# Patient Record
Sex: Female | Born: 1995 | Race: Black or African American | Hispanic: No | Marital: Single | Smoking: Current every day smoker
Health system: Southern US, Community
[De-identification: ages and names within clinical notes are randomized; demographics above are authoritative.]

## PROBLEM LIST (undated history)

## (undated) DIAGNOSIS — F329 Major depressive disorder, single episode, unspecified: Secondary | ICD-10-CM

## (undated) DIAGNOSIS — J353 Hypertrophy of tonsils with hypertrophy of adenoids: Secondary | ICD-10-CM

## (undated) DIAGNOSIS — F32A Depression, unspecified: Secondary | ICD-10-CM

## (undated) DIAGNOSIS — R0981 Nasal congestion: Secondary | ICD-10-CM

## (undated) HISTORY — PX: WISDOM TOOTH EXTRACTION: SHX21

## (undated) HISTORY — PX: TONSILLECTOMY: SUR1361

---
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
## 2015-01-23 ENCOUNTER — Encounter (HOSPITAL_COMMUNITY): Payer: Self-pay | Admitting: Emergency Medicine

## 2015-01-23 ENCOUNTER — Emergency Department (HOSPITAL_COMMUNITY)
Admission: EM | Admit: 2015-01-23 | Discharge: 2015-01-23 | Disposition: A | Discharge status: Home / Self Care | Payer: Medicaid Other | Attending: Emergency Medicine | Admitting: Emergency Medicine

## 2015-01-23 DIAGNOSIS — H5711 Ocular pain, right eye: Secondary | ICD-10-CM | POA: Diagnosis present

## 2015-01-23 DIAGNOSIS — Z8709 Personal history of other diseases of the respiratory system: Secondary | ICD-10-CM | POA: Insufficient documentation

## 2015-01-23 DIAGNOSIS — F329 Major depressive disorder, single episode, unspecified: Secondary | ICD-10-CM | POA: Insufficient documentation

## 2015-01-23 DIAGNOSIS — H18821 Corneal disorder due to contact lens, right eye: Secondary | ICD-10-CM | POA: Diagnosis not present

## 2015-01-23 DIAGNOSIS — Z79899 Other long term (current) drug therapy: Secondary | ICD-10-CM | POA: Diagnosis not present

## 2015-01-23 MED ORDER — HYDROCODONE-ACETAMINOPHEN 5-325 MG PO TABS: 1.0000 | ORAL_TABLET | ORAL | Status: DC | PRN

## 2015-01-23 MED ORDER — TETRACAINE HCL 0.5 % OP SOLN
OPHTHALMIC | Status: AC
Start: 2015-01-23 — End: 2015-01-23
  Administered 2015-01-23: 1 [drp] via OPHTHALMIC
  Filled 2015-01-23: qty 4

## 2015-01-23 MED ORDER — ERYTHROMYCIN 5 MG/GM OP OINT
TOPICAL_OINTMENT | Freq: Once | OPHTHALMIC | Status: AC
  Administered 2015-01-23: 1 via OPHTHALMIC
  Filled 2015-01-23: qty 3.5

## 2015-01-23 MED ORDER — TETRACAINE HCL 0.5 % OP SOLN
1.0000 [drp] | Freq: Once | OPHTHALMIC | Status: AC
  Administered 2015-01-23: 1 [drp] via OPHTHALMIC

## 2015-01-23 NOTE — Discharge Instructions (Signed)
Avoid rubbing your eye and do not wear your contacts for the next 7 days or until your symptoms are completely resolved. Use your glasses.  Apply erythromycin ointment to your eye every 4 hours for the next 2-4 days. You may stop once your pain is completely resolved.  Avoid bright lights (wear sunglasses).  Avoid rubbing your eye.  Get rechecked by your eye doctor in 3 days if not improved (or return here for any worsened symptoms).  You may take the hydrocodone prescribed for pain relief.  This will make you drowsy - do not drive within 4 hours of taking this medication.

## 2015-01-23 NOTE — ED Notes (Signed)
Patient c/o right eye pain and blurry vision in right eye that started yesterday after waking up. Per patient slept with contacts in the night before. Patient states she was unable to tolerate light in that eye yesterday and now having some orbital swelling.

## 2015-01-23 NOTE — ED Notes (Signed)
Pt states stinging to right eye and states eye was matted shut most of the day yesterday. Drainage is described both as clear and yellow in color. Pt states she fell to sleep with contacts in the night before last.

## 2015-01-23 NOTE — ED Provider Notes (Signed)
CSN: [id number]     Arrival date & time 01/23/15  1138 History  By signing my name below, I, Gwenyth Ober, attest that this documentation has been prepared under the direction and in the presence of Burgess Amor, PA-C.  Electronically Signed: Gwenyth Ober, ED Scribe. 01/23/2015. 12:25 PM.   Chief Complaint  Patient presents with  . Eye Pain   The history is provided by the patient. No language interpreter was used.    HPI Comments: Isabel Blackwell is a 19 y.o. female who presents to the Emergency Department complaining of constant, moderate right eye pain that started 2 days ago after she slept while wearing her contacts. She states watering of her right eye, a mild visual disturbance and some photophobia with no relief. Pt wears extended wear contacts and was in the middle of the wearing cycle. She has a history of similar symptoms in her right eye, but was not evaluated for pain. Her tetanus is UTD. Pt denies a gritty or foreign body sensation. She also denies left eye pain.  Ophthalmologist at [facility]  Past Medical History  Diagnosis Date  . Tonsillar and adenoid hypertrophy 11/2012    snores during sleep, mother denies apnea  . Nasal congestion 12/04/2012  . Depression    Past Surgical History  Procedure Laterality Date  . Tonsillectomy and adenoidectomy Bilateral 12/09/2012    Procedure: BILATERAL TONSILLECTOMY AND ADENOIDECTOMY;  Surgeon: Darletta Moll, MD;  Location: [facility];  Service: ENT;  Laterality: Bilateral;  . Wisdom tooth extraction    . Colonoscopy N/A 04/13/2014    BX NL-ILEUM, COLON, RECTUM  . Tonsillectomy     Family History  Problem Relation Age of Onset  . Hypertension Mother   . Heart disease Maternal Grandfather   . Colon cancer Neg Hx    Social History  Substance Use Topics  . Smoking status: Never Smoker   . Smokeless tobacco: Never Used  . Alcohol Use: No   OB History    Gravida Para Term Preterm AB TAB SAB Ectopic  Multiple Living       Review of Systems  Constitutional: Negative for fever.  HENT: Negative.  Negative for congestion and sore throat.   Eyes: Positive for photophobia, pain and visual disturbance.  Skin: Negative.  Negative for rash.  Neurological: Negative for dizziness, light-headedness and headaches.  All other systems reviewed and are negative.  Allergies  Nitrofurantoin monohyd macro  Home Medications   Prior to Admission medications   Medication Sig Start Date End Date Taking? Authorizing Provider  escitalopram (LEXAPRO) 10 MG tablet Take 1 tablet (10 mg total) by mouth daily. 12/06/14   Beau Fanny, FNP  etonogestrel (IMPLANON) 68 MG IMPL implant 1 each (68 mg total) by Subdermal route once. 12/06/14   Beau Fanny, FNP  HYDROcodone-acetaminophen (NORCO/VICODIN) 5-325 MG tablet Take 1 tablet by mouth every 4 (four) hours as needed. 01/23/15   Burgess Amor, PA-C  traZODone (DESYREL) 50 MG tablet Take 1 tablet (50 mg total) by mouth at bedtime as needed for sleep. 12/06/14   Everardo All Withrow, FNP   BP 125/84 mmHg  Pulse 82  Temp(Src) 98.1 F (36.7 C) (Oral)  Resp 16  Ht  (1.575 m)  Wt 43.863 kg  BMI 17.68 kg/m2  SpO2 100% Physical Exam  Constitutional: She appears well-developed and well-nourished. No distress.  HENT:  Head: Normocephalic and atraumatic.  Eyes: EOM are normal. Pupils are equal, round, and reactive to light. Right eye exhibits no chemosis. Right conjunctiva is injected.  Slit lamp exam:      The right eye shows corneal abrasion (at the 5 o'clock position of right eye at the periphery of cornea. small abrasion) and fluorescein uptake. The right eye shows no corneal ulcer and no foreign body.  Clear tearing from the right eye  Neck: Neck supple. No tracheal deviation present.  Cardiovascular: Normal rate.   Pulmonary/Chest: Effort normal. No respiratory distress.  Skin: Skin is warm and dry.  Psychiatric: She has a normal mood  and affect. Her behavior is normal.  Nursing note and vitals reviewed.   ED Course  Procedures  DIAGNOSTIC STUDIES: Oxygen Saturation is 100% on RA, normal by my interpretation.    COORDINATION OF CARE: 12:32 PM Discussed treatment plan with pt which includes erythromycin ointment and recommended follow-up with her ophthalmologist in 3 days with no improvement in symptoms. She agreed to plan.  Labs Review Labs Reviewed - No data to display  Imaging Review No results found.   EKG Interpretation None      MDM   Final diagnoses:  Corneal abrasion due to contact lens, right    Pt placed on erythromycin ophthalmic ointment (given).  Hydrocodone.  Advised to avoid contact lens use for one week or until sx completely resolved.  Advised f/u with ophthalmologist if sx are not improved over the next 2-3 days.  I personally performed the services described in this documentation, which was scribed in my presence. The recorded information has been reviewed and is accurate.   Burgess AmorJulie Bates [place], PA-C 01/25/15 2340  Blane OharaJoshua Zavitz, MD 01/28/15 [id number]

## 2015-04-08 ENCOUNTER — Emergency Department (HOSPITAL_COMMUNITY): Payer: Medicaid Other

## 2015-04-08 ENCOUNTER — Encounter (HOSPITAL_COMMUNITY): Payer: Self-pay | Admitting: Emergency Medicine

## 2015-04-08 ENCOUNTER — Emergency Department (HOSPITAL_COMMUNITY)
Admission: EM | Admit: 2015-04-08 | Discharge: 2015-04-08 | Disposition: A | Discharge status: Home / Self Care | Payer: Medicaid Other | Attending: Emergency Medicine | Admitting: Emergency Medicine

## 2015-04-08 DIAGNOSIS — Z8709 Personal history of other diseases of the respiratory system: Secondary | ICD-10-CM | POA: Diagnosis not present

## 2015-04-08 DIAGNOSIS — S29002A Unspecified injury of muscle and tendon of back wall of thorax, initial encounter: Secondary | ICD-10-CM | POA: Diagnosis not present

## 2015-04-08 DIAGNOSIS — Y92410 Unspecified street and highway as the place of occurrence of the external cause: Secondary | ICD-10-CM | POA: Diagnosis not present

## 2015-04-08 DIAGNOSIS — Y9389 Activity, other specified: Secondary | ICD-10-CM | POA: Diagnosis not present

## 2015-04-08 DIAGNOSIS — Z79899 Other long term (current) drug therapy: Secondary | ICD-10-CM | POA: Insufficient documentation

## 2015-04-08 DIAGNOSIS — R300 Dysuria: Secondary | ICD-10-CM | POA: Insufficient documentation

## 2015-04-08 DIAGNOSIS — F329 Major depressive disorder, single episode, unspecified: Secondary | ICD-10-CM | POA: Diagnosis not present

## 2015-04-08 DIAGNOSIS — Y998 Other external cause status: Secondary | ICD-10-CM | POA: Diagnosis not present

## 2015-04-08 DIAGNOSIS — Z3202 Encounter for pregnancy test, result negative: Secondary | ICD-10-CM | POA: Diagnosis not present

## 2015-04-08 DIAGNOSIS — F17210 Nicotine dependence, cigarettes, uncomplicated: Secondary | ICD-10-CM | POA: Diagnosis not present

## 2015-04-08 DIAGNOSIS — M546 Pain in thoracic spine: Secondary | ICD-10-CM

## 2015-04-08 LAB — URINALYSIS, ROUTINE W REFLEX MICROSCOPIC
Bilirubin Urine: NEGATIVE
Glucose, UA: NEGATIVE mg/dL
Ketones, ur: 15 mg/dL — AB
LEUKOCYTES UA: NEGATIVE
NITRITE: NEGATIVE
PH: 6.5 (ref 5.0–8.0)
SPECIFIC GRAVITY, URINE: 1.025 (ref 1.005–1.030)

## 2015-04-08 LAB — URINE MICROSCOPIC-ADD ON

## 2015-04-08 LAB — POC URINE PREG, ED: Preg Test, Ur: NEGATIVE

## 2015-04-08 MED ORDER — CEPHALEXIN 500 MG PO CAPS: 500.0000 mg | ORAL_CAPSULE | Freq: Four times a day (QID) | ORAL | Status: DC

## 2015-04-08 MED ORDER — CYCLOBENZAPRINE HCL 10 MG PO TABS: 10.0000 mg | ORAL_TABLET | Freq: Two times a day (BID) | ORAL | Status: DC | PRN
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
## 2015-04-08 NOTE — Discharge Instructions (Signed)
X-ray of back is normal. Minor evidence of a urinary tract infection. Prescription for muscle relaxer and antibiotic. Increase fluids. You will be sore for several days.

## 2015-04-11 LAB — URINE CULTURE

## 2015-04-12 ENCOUNTER — Telehealth (HOSPITAL_BASED_OUTPATIENT_CLINIC_OR_DEPARTMENT_OTHER): Payer: Self-pay | Admitting: Emergency Medicine

## 2015-04-12 NOTE — Telephone Encounter (Signed)
Post ED Visit - Positive Culture Follow-up  Culture report reviewed by antimicrobial stewardship pharmacist:   Enzo Bi, Pharm.D.  Celedonio Miyamoto, Pharm.D., BCPS  Garvin Fila, Pharm.D.  Georgina Pillion, Pharm.D., BCPS  [place], [address].D., BCPS, AAHIVP  Estella Husk, Pharm.D., BCPS, AAHIVP  Tennis Must, [address].D.  Sherle Poe, [address].D.  Positive urine culture lactobacillus Treated with cephalexin, organism sensitive to the same and no further patient follow-up is required at this time.  Berle Mull 04/12/2015, 9:01 AM

## 2015-05-18 ENCOUNTER — Encounter (HOSPITAL_COMMUNITY): Payer: Self-pay | Admitting: *Deleted

## 2015-05-18 ENCOUNTER — Emergency Department (HOSPITAL_COMMUNITY)
Admission: EM | Admit: 2015-05-18 | Discharge: 2015-05-18 | Disposition: A | Discharge status: Home / Self Care | Payer: Medicaid Other | Attending: Emergency Medicine | Admitting: Emergency Medicine

## 2015-05-18 DIAGNOSIS — Z5321 Procedure and treatment not carried out due to patient leaving prior to being seen by health care provider: Secondary | ICD-10-CM | POA: Diagnosis not present

## 2015-05-18 DIAGNOSIS — H5712 Ocular pain, left eye: Secondary | ICD-10-CM | POA: Diagnosis present

## 2015-05-18 DIAGNOSIS — F17210 Nicotine dependence, cigarettes, uncomplicated: Secondary | ICD-10-CM | POA: Diagnosis not present

## 2015-05-18 DIAGNOSIS — F329 Major depressive disorder, single episode, unspecified: Secondary | ICD-10-CM | POA: Insufficient documentation

## 2015-05-18 MED ORDER — TETRACAINE HCL 0.5 % OP SOLN
2.0000 [drp] | Freq: Once | OPHTHALMIC | Status: DC
  Filled 2015-05-18: qty 4

## 2015-05-18 NOTE — ED Notes (Signed)
Pt reports she slept in her contacts last night and now has left eye pain. Denies injury.

## 2015-05-18 NOTE — ED Notes (Signed)
Patient states that she has to leave and be home when her 20 year old brother gets off the bus at 1430 and it is a 10 minute drive home.  Patient signed out AMA, may return later.

## 2015-06-16 ENCOUNTER — Emergency Department (HOSPITAL_COMMUNITY)
Admission: EM | Admit: 2015-06-16 | Discharge: 2015-06-16 | Discharge status: Against Medical Advice | Payer: Medicaid Other | Attending: Emergency Medicine | Admitting: Emergency Medicine

## 2015-06-16 ENCOUNTER — Encounter (HOSPITAL_COMMUNITY): Payer: Self-pay | Admitting: Emergency Medicine

## 2015-06-16 DIAGNOSIS — F17210 Nicotine dependence, cigarettes, uncomplicated: Secondary | ICD-10-CM | POA: Insufficient documentation

## 2015-06-16 DIAGNOSIS — R197 Diarrhea, unspecified: Secondary | ICD-10-CM | POA: Diagnosis not present

## 2015-06-16 DIAGNOSIS — F329 Major depressive disorder, single episode, unspecified: Secondary | ICD-10-CM | POA: Diagnosis not present

## 2015-06-16 LAB — CBC
HCT: 40.5 % (ref 36.0–46.0)
HEMOGLOBIN: 13.7 g/dL (ref 12.0–15.0)
MCH: 31.7 pg (ref 26.0–34.0)
MCHC: 33.8 g/dL (ref 30.0–36.0)
MCV: 93.8 fL (ref 78.0–100.0)
Platelets: 321 10*3/uL (ref 150–400)
RBC: 4.32 MIL/uL (ref 3.87–5.11)
RDW: 12.7 % (ref 11.5–15.5)
WBC: 3.9 10*3/uL — AB (ref 4.0–10.5)

## 2015-06-16 LAB — URINALYSIS, ROUTINE W REFLEX MICROSCOPIC
Bilirubin Urine: NEGATIVE
GLUCOSE, UA: NEGATIVE mg/dL
Hgb urine dipstick: NEGATIVE
Ketones, ur: NEGATIVE mg/dL
Leukocytes, UA: NEGATIVE
NITRITE: NEGATIVE
PROTEIN: NEGATIVE mg/dL
SPECIFIC GRAVITY, URINE: 1.01 (ref 1.005–1.030)
pH: 6 (ref 5.0–8.0)

## 2015-06-16 LAB — COMPREHENSIVE METABOLIC PANEL
ALK PHOS: 49 U/L (ref 38–126)
ALT: 13 U/L — ABNORMAL LOW (ref 14–54)
ANION GAP: 8 (ref 5–15)
AST: 16 U/L (ref 15–41)
Albumin: 4.7 g/dL (ref 3.5–5.0)
BILIRUBIN TOTAL: 0.7 mg/dL (ref 0.3–1.2)
BUN: 7 mg/dL (ref 6–20)
CALCIUM: 9.7 mg/dL (ref 8.9–10.3)
CO2: 25 mmol/L (ref 22–32)
Chloride: 104 mmol/L (ref 101–111)
Creatinine, Ser: 0.68 mg/dL (ref 0.44–1.00)
Glucose, Bld: 84 mg/dL (ref 65–99)
Potassium: 4.2 mmol/L (ref 3.5–5.1)
SODIUM: 137 mmol/L (ref 135–145)
TOTAL PROTEIN: 8 g/dL (ref 6.5–8.1)

## 2015-06-16 LAB — PREGNANCY, URINE: Preg Test, Ur: NEGATIVE

## 2015-06-16 LAB — TSH: TSH: 1.595 u[IU]/mL (ref 0.350–4.500)

## 2015-06-16 MED ORDER — DIPHENOXYLATE-ATROPINE 2.5-0.025 MG PO TABS
2.0000 | ORAL_TABLET | Freq: Once | ORAL | Status: AC
  Administered 2015-06-16: 2 via ORAL
  Filled 2015-06-16: qty 2

## 2015-06-16 NOTE — ED Notes (Signed)
Pt states she has been having diarrhea since yesterday with no appetite.  Denies vomiting.  Has not tried any otc meds.

## 2015-06-16 NOTE — ED Notes (Signed)
Pt found walking with family member. Pt asked if she is leaving and pt stated, "I am getting a headache. All y'all gave me was some diarrhea medicine. I just want to go home and lay down." Pt informed she would be leaving AMA. Verbalized understanding.

## 2015-06-16 NOTE — ED Notes (Signed)
Registration came back requesting a work note for patient. Explained that work notes are not given when a pt leaves AMA. Consulting civil engineerCharge RN notified.

## 2015-06-16 NOTE — ED Provider Notes (Signed)
CSN: [id number]     Arrival date & time 06/16/15  1358 History   First MD Initiated Contact with Patient 06/16/15 1454     Chief Complaint  Patient presents with  . Diarrhea      HPI   Patient evaluation of diarrhea for 2 days, and loss of appetite for 1 year.  Position some nausea yesterday but no vomiting.  Diarrhea 5 times since morning present she feels weak and has a mild headache.  States that she is a poor appetite for the last year and that she initially lost 15 pounds, but about 5 pounds back on.  She is in pain.  No DynaVision dysphagia.  No nausea or vomiting present she says that appetite "I just can't hardly get it down, it just doesn't sound good"  Past Medical History  Diagnosis Date  . Tonsillar and adenoid hypertrophy 11/2012    snores during sleep, mother denies apnea  . Nasal congestion 12/04/2012  . Depression    Past Surgical History  Procedure Laterality Date  . Tonsillectomy and adenoidectomy Bilateral 12/09/2012    Procedure: BILATERAL TONSILLECTOMY AND ADENOIDECTOMY;  Surgeon: Darletta MollSui W Teoh, MD;  Location: [facility];  Service: ENT;  Laterality: Bilateral;  . Wisdom tooth extraction    . Colonoscopy N/A 04/13/2014    BX NL-ILEUM, COLON, RECTUM  . Tonsillectomy     Family History  Problem Relation Age of Onset  . Hypertension Mother   . Heart disease Maternal Grandfather   . Colon cancer Neg Hx    Social History  Substance Use Topics  . Smoking status: Current Every Day Smoker -- 0.50 packs/day    Types: Cigarettes  . Smokeless tobacco: Never Used  . Alcohol Use: No   OB History    Gravida Para Term Preterm AB TAB SAB Ectopic Multiple Living   0 0 0 0 0 0 0 0 0 0      Review of Systems  Constitutional: Positive for appetite change and unexpected weight change. Negative for fever, chills, diaphoresis and fatigue.  HENT: Negative for mouth sores, sore throat and trouble swallowing.   Eyes: Negative for visual disturbance.   Respiratory: Negative for cough, chest tightness, shortness of breath and wheezing.   Cardiovascular: Negative for chest pain.  Gastrointestinal: Positive for nausea and diarrhea. Negative for vomiting, abdominal pain and abdominal distention.  Endocrine: Negative for polydipsia, polyphagia and polyuria.  Genitourinary: Negative for dysuria, frequency and hematuria.  Musculoskeletal: Negative for gait problem.  Skin: Negative for color change, pallor and rash.  Neurological: Negative for dizziness, syncope, light-headedness and headaches.  Hematological: Does not bruise/bleed easily.  Psychiatric/Behavioral: Negative for behavioral problems and confusion.      Allergies  Nitrofurantoin monohyd macro  Home Medications   Prior to Admission medications   Medication Sig Start Date End Date Taking? Authorizing Provider  levonorgestrel-ethinyl estradiol (AVIANE,ALESSE,LESSINA) 0.1-20 MG-MCG tablet Take 1 tablet by mouth daily.   Yes Historical Provider, MD  cephALEXin (KEFLEX) 500 MG capsule Take 1 capsule (500 mg total) by mouth 4 (four) times daily. Patient not taking: Reported on 06/16/2015 04/08/15   Donnetta HutchingBrian Cook, MD  cyclobenzaprine (FLEXERIL) 10 MG tablet Take 1 tablet (10 mg total) by mouth 2 (two) times daily as needed for muscle spasms. Patient not taking: Reported on 06/16/2015 04/08/15   Donnetta HutchingBrian Cook, MD   BP 119/83 mmHg  Pulse 82  Temp(Src) 98 F (36.7 C) (Oral)  Resp 18  Ht 5\' 4"  (1.626 m)  Wt  100 lb (45.36 kg)  BMI 17.16 kg/m2  SpO2 100% Physical Exam  Constitutional: She is oriented to person, place, and time. She appears well-developed and well-nourished. No distress.  HENT:  Head: Normocephalic.  Eyes: Conjunctivae are normal. Pupils are equal, round, and reactive to light. No scleral icterus.  Neck: Normal range of motion. Neck supple. No thyromegaly present.  Cardiovascular: Normal rate and regular rhythm.  Exam reveals no gallop and no friction rub.   No murmur  heard. Pulmonary/Chest: Effort normal and breath sounds normal. No respiratory distress. She has no wheezes. She has no rales.  Abdominal: Soft. Bowel sounds are normal. She exhibits no distension. There is no tenderness. There is no rebound.  Musculoskeletal: Normal range of motion.  Neurological: She is alert and oriented to person, place, and time.  Skin: Skin is warm and dry. No rash noted.  Psychiatric: She has a normal mood and affect. Her behavior is normal.    ED Course  Procedures (including critical care time) Labs Review Labs Reviewed  COMPREHENSIVE METABOLIC PANEL - Abnormal; Notable for the following:    ALT 13 (*)    All other components within normal limits  CBC - Abnormal; Notable for the following:    WBC 3.9 (*)    All other components within normal limits  URINALYSIS, ROUTINE W REFLEX MICROSCOPIC (NOT AT [facility]) - Abnormal; Notable for the following:    APPearance HAZY (*)    All other components within normal limits  PREGNANCY, URINE  TSH    Imaging Review No results found. I have personally reviewed and evaluated these images and lab results as part of my medical decision-making.   EKG Interpretation None      MDM   Final diagnoses:  Diarrhea, unspecified type    Patient left AMA before I had opportunity to talk to her about the studies and the results.  Certainly no suggestion of life or limb threatening event based on her symptoms presented above.    Rolland Porter, MD 06/16/15 1655

## 2015-10-03 ENCOUNTER — Encounter (HOSPITAL_COMMUNITY): Payer: Self-pay | Admitting: Emergency Medicine

## 2015-10-03 ENCOUNTER — Emergency Department (HOSPITAL_COMMUNITY)
Admission: EM | Admit: 2015-10-03 | Discharge: 2015-10-03 | Disposition: A | Discharge status: Home / Self Care | Payer: Medicaid Other | Attending: Emergency Medicine | Admitting: Emergency Medicine

## 2015-10-03 DIAGNOSIS — N898 Other specified noninflammatory disorders of vagina: Secondary | ICD-10-CM | POA: Diagnosis present

## 2015-10-03 DIAGNOSIS — N760 Acute vaginitis: Secondary | ICD-10-CM | POA: Insufficient documentation

## 2015-10-03 DIAGNOSIS — B9689 Other specified bacterial agents as the cause of diseases classified elsewhere: Secondary | ICD-10-CM

## 2015-10-03 DIAGNOSIS — F17210 Nicotine dependence, cigarettes, uncomplicated: Secondary | ICD-10-CM | POA: Diagnosis not present

## 2015-10-03 LAB — URINALYSIS, ROUTINE W REFLEX MICROSCOPIC
BILIRUBIN URINE: NEGATIVE
Glucose, UA: NEGATIVE mg/dL
Ketones, ur: NEGATIVE mg/dL
NITRITE: NEGATIVE
PROTEIN: NEGATIVE mg/dL
SPECIFIC GRAVITY, URINE: 1.015 (ref 1.005–1.030)
pH: 7 (ref 5.0–8.0)

## 2015-10-03 LAB — URINE MICROSCOPIC-ADD ON

## 2015-10-03 LAB — WET PREP, GENITAL
SPERM: NONE SEEN
TRICH WET PREP: NONE SEEN
Yeast Wet Prep HPF POC: NONE SEEN

## 2015-10-03 LAB — PREGNANCY, URINE: Preg Test, Ur: NEGATIVE

## 2015-10-03 MED ORDER — CEFTRIAXONE SODIUM 250 MG IJ SOLR
250.0000 mg | Freq: Once | INTRAMUSCULAR | Status: AC
  Administered 2015-10-03: 250 mg via INTRAMUSCULAR
  Filled 2015-10-03: qty 250

## 2015-10-03 MED ORDER — AZITHROMYCIN 250 MG PO TABS
1000.0000 mg | ORAL_TABLET | Freq: Once | ORAL | Status: AC
  Administered 2015-10-03: 1000 mg via ORAL
  Filled 2015-10-03: qty 4

## 2015-10-03 MED ORDER — METRONIDAZOLE 500 MG PO TABS: 500.0000 mg | ORAL_TABLET | Freq: Two times a day (BID) | ORAL | 0 refills | Status: DC

## 2015-10-03 MED ORDER — LIDOCAINE HCL (PF) 1 % IJ SOLN
INTRAMUSCULAR | Status: AC
  Filled 2015-10-03: qty 5

## 2015-10-03 NOTE — Discharge Instructions (Signed)
Follow up with the health department next week °

## 2015-10-03 NOTE — ED Provider Notes (Signed)
[facility]-EMERGENCY DEPT Provider Note   CSN: [id number] Arrival date & time: 10/03/15  1230     History   Chief Complaint Chief Complaint  Patient presents with  . Vaginal Discharge    HPI Isabel Blackwell is a 20 y.o. female.   Patient complains of vaginal discharge and burning on urination   The history is provided by the patient. No language interpreter was used.  Vaginal Discharge   This is a new problem. The current episode started 12 to 24 hours ago. The problem occurs constantly. The problem has not changed since onset.The discharge occurs after intercourse. The discharge was white. She is not pregnant. Associated symptoms include dysuria. Pertinent negatives include no anorexia, no abdominal pain, no diarrhea and no frequency. She has tried nothing for the symptoms. Her past medical history does not include STD.    Past Medical History:  Diagnosis Date  . Depression   . Nasal congestion 12/04/2012  . Tonsillar and adenoid hypertrophy 11/2012   snores during sleep, mother denies apnea    Patient Active Problem List   Diagnosis Date Noted  . Severe episode of recurrent major depressive disorder, without psychotic features (HCC)   . MDD (major depressive disorder), recurrent episode, severe (HCC) 12/05/2014  . AP (abdominal pain)   . Abdominal pain 03/25/2014  . Rectal bleeding 03/25/2014  . Insomnia 03/09/2014  . Loss of weight 03/09/2014  . Depression 03/09/2014  . Neuropathy (HCC) 03/05/2012    Past Surgical History:  Procedure Laterality Date  . COLONOSCOPY N/A 04/13/2014   BX NL-ILEUM, COLON, RECTUM  . TONSILLECTOMY    . TONSILLECTOMY AND ADENOIDECTOMY Bilateral 12/09/2012   Procedure: BILATERAL TONSILLECTOMY AND ADENOIDECTOMY;  Surgeon: Darletta MollSui W Teoh, MD;  Location: [facility];  Service: ENT;  Laterality: Bilateral;  . WISDOM TOOTH EXTRACTION      OB History    Gravida Para Term Preterm AB Living   1 0 0 0 0 0   SAB TAB Ectopic Multiple  Live Births   0 0 0 0         Home Medications    Prior to Admission medications   Medication Sig Start Date End Date Taking? Authorizing Provider  metroNIDAZOLE (FLAGYL) 500 MG tablet Take 1 tablet (500 mg total) by mouth 2 (two) times daily. One po bid x 7 days 10/03/15   Bethann BerkshireJoseph Keltie Labell, MD    Family History Family History  Problem Relation Age of Onset  . Hypertension Mother   . Heart disease Maternal Grandfather   . Colon cancer Neg Hx     Social History Social History  Substance Use Topics  . Smoking status: Current Every Day Smoker    Packs/day: 0.50    Types: Cigarettes  . Smokeless tobacco: Never Used  . Alcohol use No     Allergies   Nitrofurantoin monohyd macro   Review of Systems Review of Systems  Constitutional: Negative for appetite change and fatigue.  HENT: Negative for congestion, ear discharge and sinus pressure.   Eyes: Negative for discharge.  Respiratory: Negative for cough.   Cardiovascular: Negative for chest pain.  Gastrointestinal: Negative for abdominal pain, anorexia and diarrhea.  Genitourinary: Positive for dysuria and vaginal discharge. Negative for frequency and hematuria.  Musculoskeletal: Negative for back pain.  Skin: Negative for rash.  Neurological: Negative for seizures and headaches.  Psychiatric/Behavioral: Negative for hallucinations.     Physical Exam Updated Vital Signs BP 114/72 (BP Location: Right Arm)   Pulse 77  Temp 98.2 F (36.8 C) (Oral)   Resp 16   Ht 5\' 2"  (1.575 m)   Wt 100 lb (45.4 kg)   LMP 08/30/2015   SpO2 100%   BMI 18.29 kg/m   Physical Exam  Constitutional: She is oriented to person, place, and time. She appears well-developed.  HENT:  Head: Normocephalic.  Eyes: Conjunctivae are normal.  Neck: No tracheal deviation present.  Cardiovascular:  No murmur heard. Genitourinary:  Genitourinary Comments: White vaginal discharge nontender cervix and adnexal  Musculoskeletal: Normal range of  motion.  Neurological: She is oriented to person, place, and time.  Skin: Skin is warm.  Psychiatric: She has a normal mood and affect.     ED Treatments / Results  Labs (all labs ordered are listed, but only abnormal results are displayed) Labs Reviewed  WET PREP, GENITAL - Abnormal; Notable for the following:       Result Value   Clue Cells Wet Prep HPF POC PRESENT (*)    WBC, Wet Prep HPF POC FEW (*)    All other components within normal limits  URINALYSIS, ROUTINE W REFLEX MICROSCOPIC (NOT AT [facility]) - Abnormal; Notable for the following:    Hgb urine dipstick TRACE (*)    Leukocytes, UA TRACE (*)    All other components within normal limits  URINE MICROSCOPIC-ADD ON - Abnormal; Notable for the following:    Squamous Epithelial / LPF 6-30 (*)    Bacteria, UA MANY (*)    All other components within normal limits  URINE CULTURE  PREGNANCY, URINE  GC/CHLAMYDIA PROBE AMP () NOT AT [facility]    EKG  EKG Interpretation None       Radiology No results found.  Procedures Procedures (including critical care time)  Medications Ordered in ED Medications  azithromycin (ZITHROMAX) tablet 1,000 mg (not administered)  cefTRIAXone (ROCEPHIN) injection 250 mg (not administered)     Initial Impression / Assessment and Plan / ED Course  I have reviewed the triage vital signs and the nursing notes.  Pertinent labs & imaging results that were available during my care of the patient were reviewed by me and considered in my medical decision making (see chart for details).  Clinical Course    Wet prep is positive for BV. Patient will have urine culture done GC chlamydia pending. She will be treated with Rocephin and doxycycline and Flagyl and will follow-up with the health department next week  Final Clinical Impressions(s) / ED Diagnoses   Final diagnoses:  BV (bacterial vaginosis)    New Prescriptions New Prescriptions   METRONIDAZOLE (FLAGYL) 500 MG TABLET     Take 1 tablet (500 mg total) by mouth 2 (two) times daily. One po bid x 7 days     Bethann BerkshireJoseph Mahin Guardia, MD 10/03/15 [phone]

## 2015-10-03 NOTE — ED Notes (Signed)
Patient with no complaints at this time. Respirations even and unlabored. Skin warm/dry. Discharge instructions reviewed with patient at this time. Patient given opportunity to voice concerns/ask questions. Patient discharged at this time and left Emergency Department with steady gait. Patient did not want to wait for shot time post rocephin IM injection. Advised of s/s allergic reaction and symptoms regarding immediate return to ED.

## 2015-10-03 NOTE — ED Triage Notes (Signed)
PT c/o vaginal discharge and painful urination with burning x1 week. PT states recent unprotected sex.

## 2015-10-04 LAB — GC/CHLAMYDIA PROBE AMP (~~LOC~~) NOT AT ARMC
Chlamydia: NEGATIVE
Neisseria Gonorrhea: NEGATIVE

## 2015-10-06 LAB — URINE CULTURE: Culture: 30000 — AB

## 2015-10-07 ENCOUNTER — Telehealth (HOSPITAL_BASED_OUTPATIENT_CLINIC_OR_DEPARTMENT_OTHER): Payer: Self-pay

## 2015-10-07 NOTE — Telephone Encounter (Signed)
Post ED Visit - Positive Culture Follow-up  Culture report reviewed by antimicrobial stewardship pharmacist:  []  Isabel Blackwell, Pharm.D. []  Isabel Blackwell, Pharm.D., BCPS []  Isabel Blackwell, Pharm.D. []  Isabel Blackwell, Pharm.D., BCPS []  Isabel Blackwell, [place] BCPS, AAHIVP []  Isabel Blackwell, Pharm.D., BCPS, AAHIVP []  Isabel Blackwell, Pharm.D. []  Isabel Blackwell, [place]arm[place]. Isabel Blackwell Pharm D Positive urine culture Treated with Metronidazole, organism sensitive to the same and no further patient follow-up is required at this time.  Isabel Blackwell, Isabel Blackwell 10/07/2015, 9:09 AM

## 2015-11-06 IMAGING — CT CT ABD-PELV W/ CM
2 of 4 series · 16 of 46 positions shown, 18 images · IV contrast (Omnipaque 300)
Comparison: None.

CLINICAL DATA: Mid abdominal pain.

EXAM:
CT ABDOMEN AND PELVIS WITH CONTRAST
TECHNIQUE: Multidetector CT imaging of the abdomen and pelvis was performed
using the standard protocol following bolus administration of
intravenous contrast.
CONTRAST:  100mL OMNIPAQUE IOHEXOL 300 MG/ML  SOLN

[Series 2: abd_pel_with 5.0 b40f · axial · 0.59mm/px · z∈[-404,-44]mm · 13 of 80 slices shown, 15 images]
[im 4/80  soft-tissue]
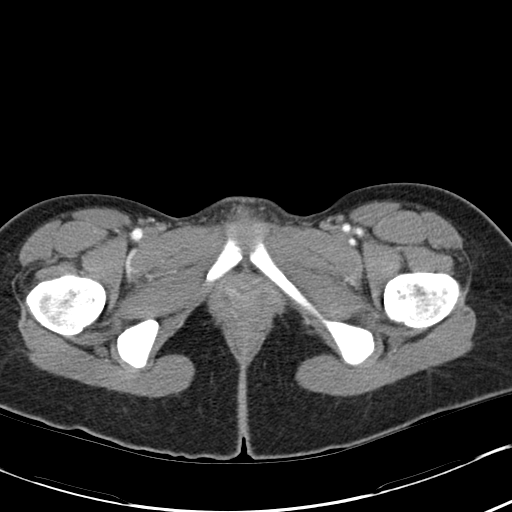
[im 4/80  bone]
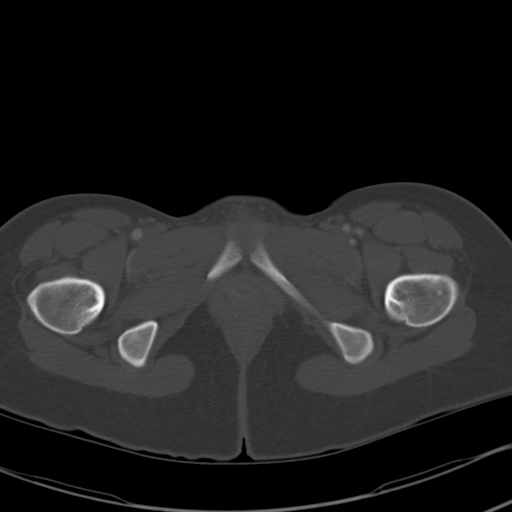
[im 11/80  soft-tissue]
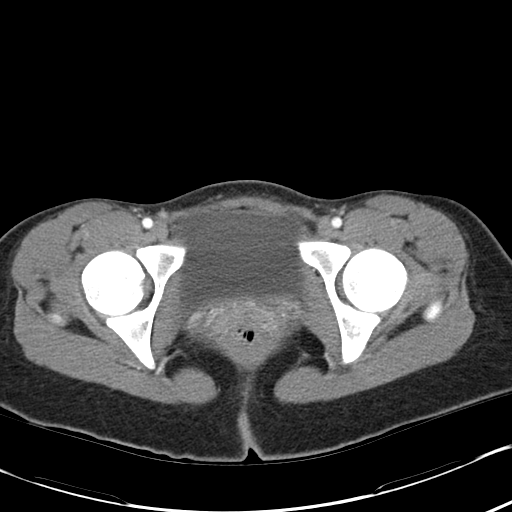
[im 18/80  soft-tissue]
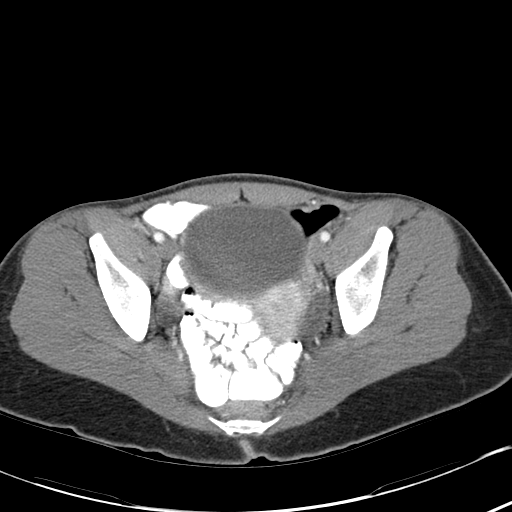
[im 22/80  soft-tissue]
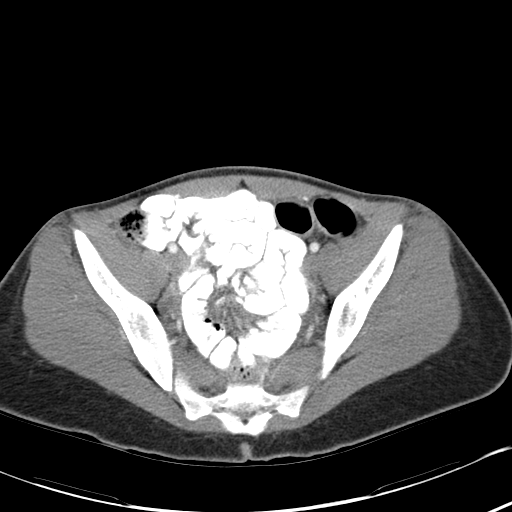
[im 29/80  soft-tissue]
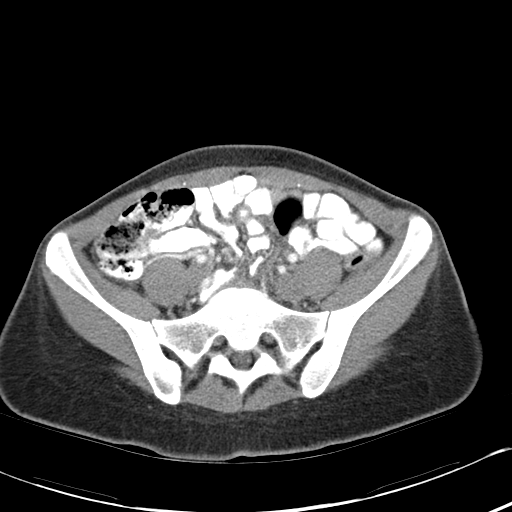
[im 33/80  soft-tissue]
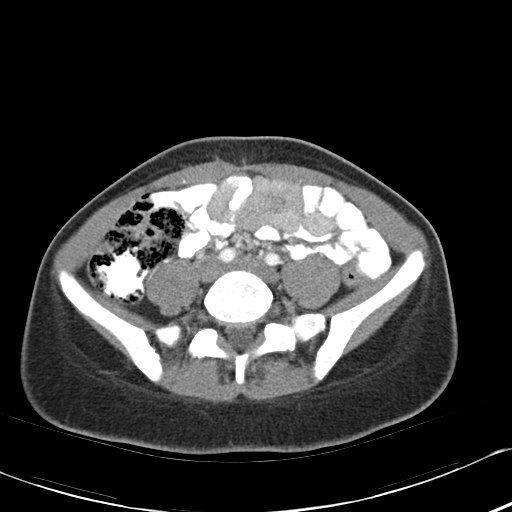
[im 40/80  soft-tissue]
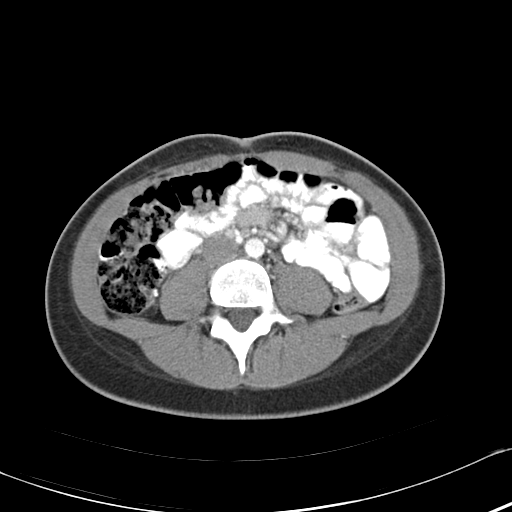
[im 47/80  soft-tissue]
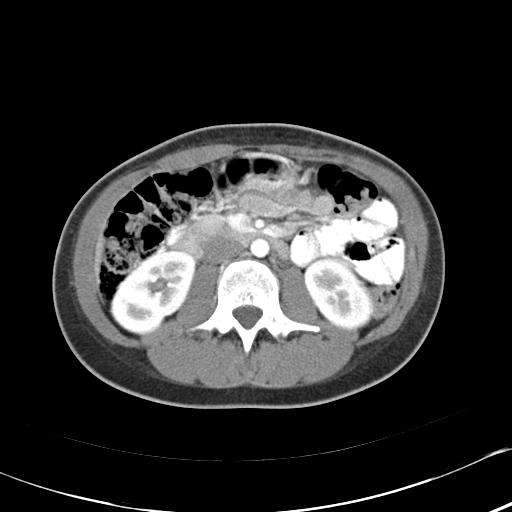
[im 51/80  soft-tissue]
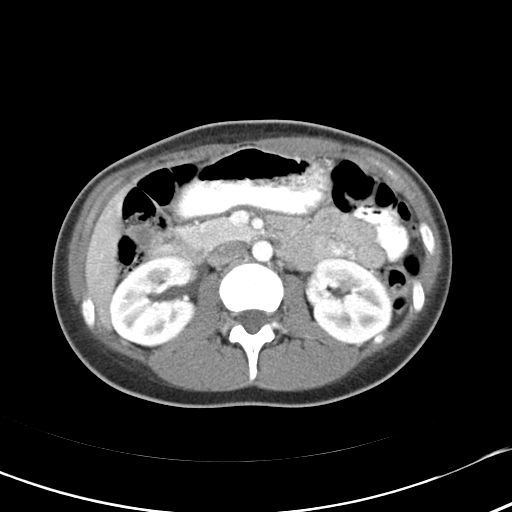
[im 51/80  bone]
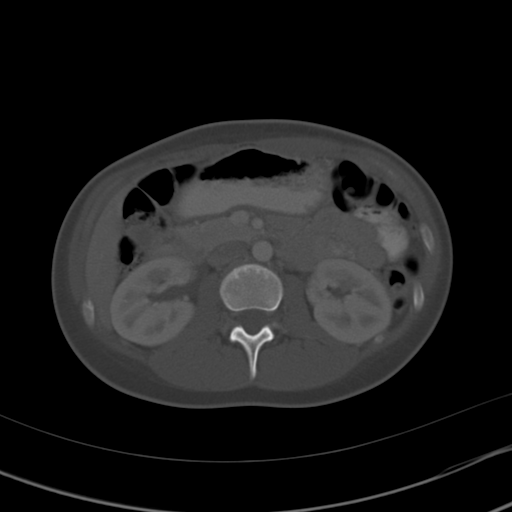
[im 58/80  soft-tissue]
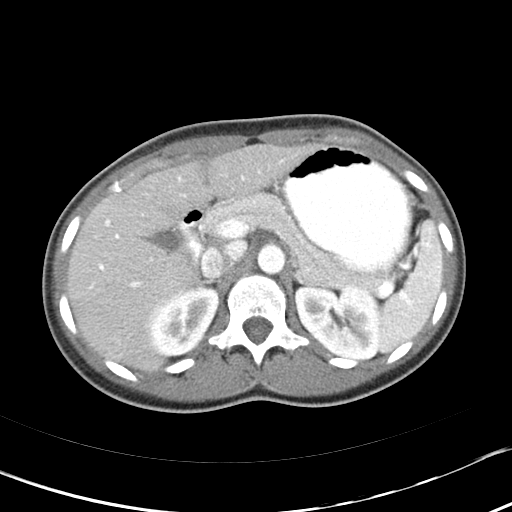
[im 62/80  soft-tissue]
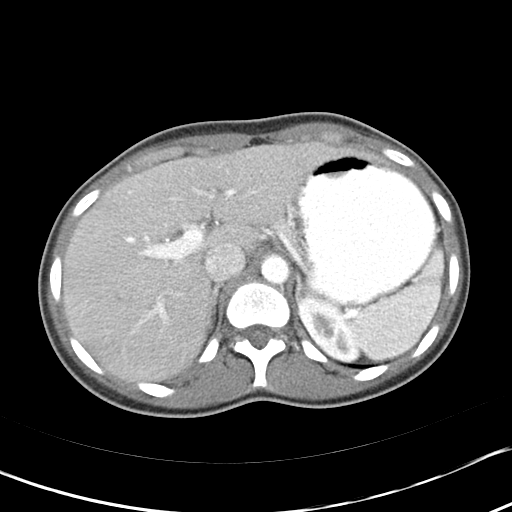
[im 69/80  soft-tissue]
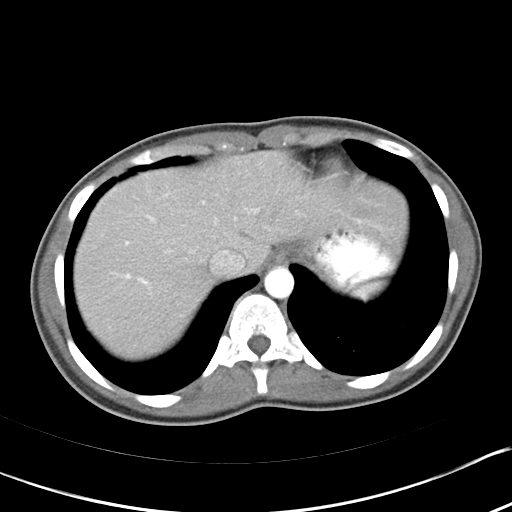
[im 76/80  soft-tissue]
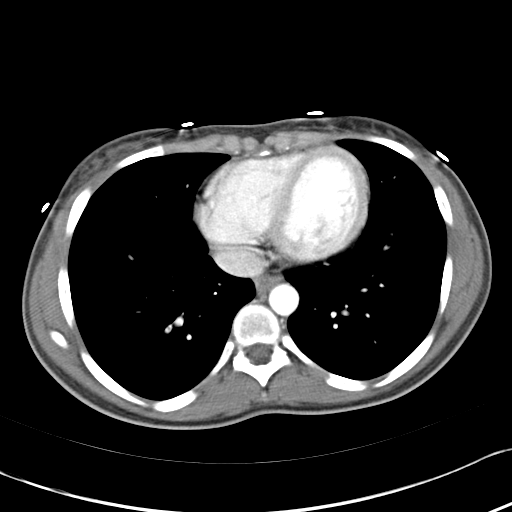

[Series 3: abd_pel_with 3.0 spo cor · coronal · 0.59mm/px · 3 of 55 slices shown]
[im 19/55  soft-tissue]
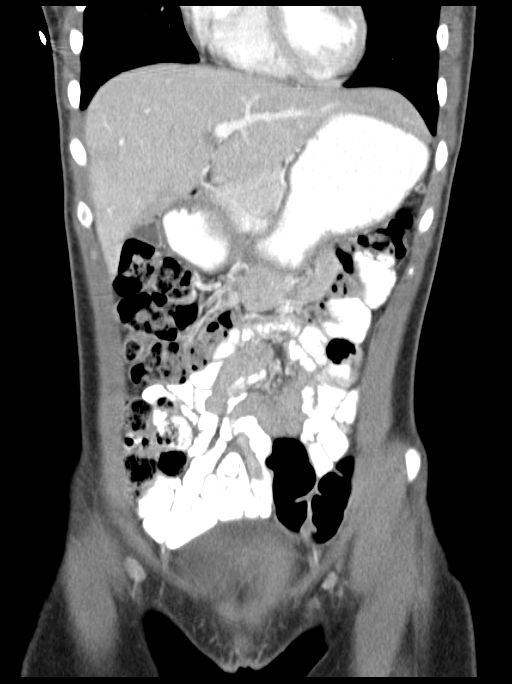
[im 25/55  soft-tissue]
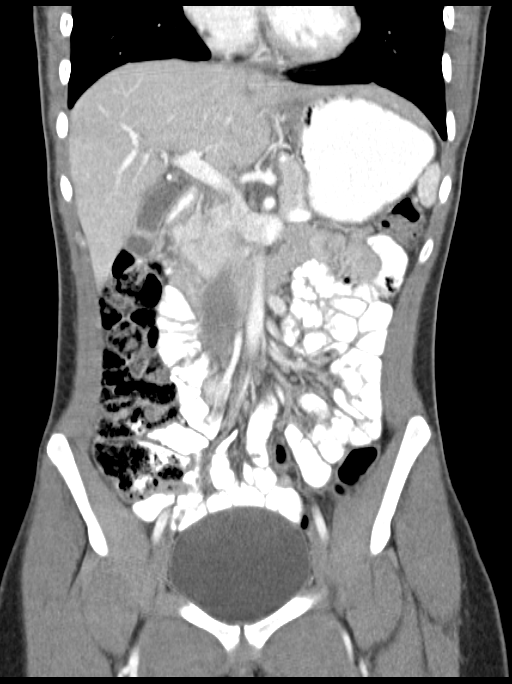
[im 31/55  soft-tissue]
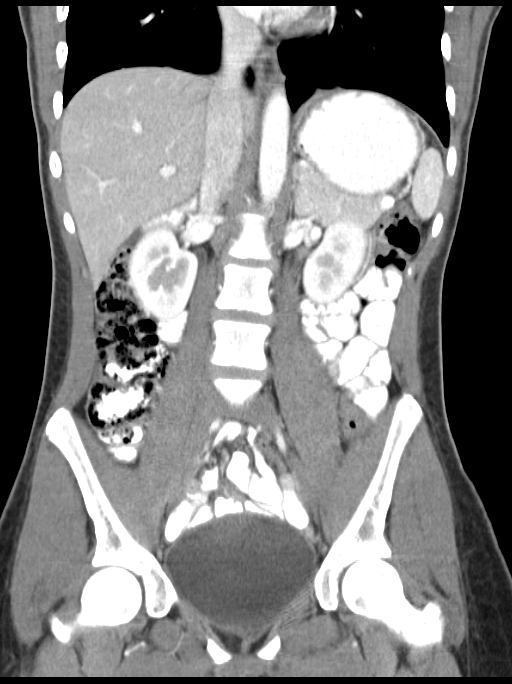

[16 of 46 positions shown; findings below may reference images not displayed]

FINDINGS: Visualization of the lower thorax demonstrates no consolidative or
nodular pulmonary opacities. Normal heart size.

Liver is normal in size and contour without focal hepatic lesion
identified. Gallbladder is unremarkable. Spleen, pancreas and
bilateral adrenal glands are unremarkable. Kidneys enhance
symmetrically with contrast. No hydronephrosis.

Normal caliber abdominal aorta. No retroperitoneal lymphadenopathy.
Urinary bladder is unremarkable. Uterus and ovaries are
unremarkable.

The appendix is visualized within the right lower quadrant. There is
fluid in the tip of the appendix which measures up to 8 mm.
Suggestion of mild appendiceal wall enhancement (image 36; coronal
series). No significant periappendiceal fluid or fat stranding.

No evidence for bowel obstruction.

No aggressive or acute appearing osseous lesions.
IMPRESSION: Appendix is visualized within the right lower quadrant. The
appendiceal tip is upper limits of normal in size measuring 8 mm and
fluid-filled. There is suggestion of mild appendiceal wall
enhancement at the mid aspect of the appendix. If the patient has
presented with acute symptomatology, this may represent an early
appendicitis. In the setting of longer standing symptoms, this may
represent the normal appearance of the appendix for this patient.

These results were called by telephone at the time of interpretation
on 08/15/2013 at [DATE] to Dr. Agilar, who verbally acknowledged
these results.

## 2016-01-24 ENCOUNTER — Emergency Department (HOSPITAL_COMMUNITY)
Admission: EM | Admit: 2016-01-24 | Discharge: 2016-01-24 | Disposition: A | Discharge status: Home / Self Care | Payer: Medicaid Other | Attending: Emergency Medicine | Admitting: Emergency Medicine

## 2016-01-24 ENCOUNTER — Encounter (HOSPITAL_COMMUNITY): Payer: Self-pay | Admitting: *Deleted

## 2016-01-24 ENCOUNTER — Emergency Department (HOSPITAL_COMMUNITY): Payer: Medicaid Other

## 2016-01-24 DIAGNOSIS — N83201 Unspecified ovarian cyst, right side: Secondary | ICD-10-CM | POA: Diagnosis not present

## 2016-01-24 DIAGNOSIS — N83202 Unspecified ovarian cyst, left side: Secondary | ICD-10-CM | POA: Insufficient documentation

## 2016-01-24 DIAGNOSIS — F17210 Nicotine dependence, cigarettes, uncomplicated: Secondary | ICD-10-CM | POA: Insufficient documentation

## 2016-01-24 DIAGNOSIS — R1032 Left lower quadrant pain: Secondary | ICD-10-CM | POA: Diagnosis present

## 2016-01-24 DIAGNOSIS — R1030 Lower abdominal pain, unspecified: Secondary | ICD-10-CM

## 2016-01-24 DIAGNOSIS — R52 Pain, unspecified: Secondary | ICD-10-CM

## 2016-01-24 LAB — COMPREHENSIVE METABOLIC PANEL
ALBUMIN: 4.3 g/dL (ref 3.5–5.0)
ALT: 16 U/L (ref 14–54)
AST: 18 U/L (ref 15–41)
Alkaline Phosphatase: 42 U/L (ref 38–126)
Anion gap: 7 (ref 5–15)
BUN: 11 mg/dL (ref 6–20)
CHLORIDE: 104 mmol/L (ref 101–111)
CO2: 26 mmol/L (ref 22–32)
CREATININE: 0.57 mg/dL (ref 0.44–1.00)
Calcium: 9.1 mg/dL (ref 8.9–10.3)
GFR calc Af Amer: >60 mL/min (ref >60)
GFR calc non Af Amer: >60 mL/min (ref >60)
GLUCOSE: 88 mg/dL (ref 65–99)
POTASSIUM: 3.4 mmol/L — AB (ref 3.5–5.1)
SODIUM: 137 mmol/L (ref 135–145)
Total Bilirubin: 0.5 mg/dL (ref 0.3–1.2)
Total Protein: 7.3 g/dL (ref 6.5–8.1)

## 2016-01-24 LAB — URINALYSIS, ROUTINE W REFLEX MICROSCOPIC
Bilirubin Urine: NEGATIVE
GLUCOSE, UA: NEGATIVE mg/dL
KETONES UR: NEGATIVE mg/dL
LEUKOCYTES UA: NEGATIVE
Nitrite: NEGATIVE
PH: 6 (ref 5.0–8.0)
Protein, ur: NEGATIVE mg/dL
Specific Gravity, Urine: >1.03 — ABNORMAL HIGH (ref 1.005–1.030)

## 2016-01-24 LAB — CBC
HEMATOCRIT: 39.5 % (ref 36.0–46.0)
Hemoglobin: 13.1 g/dL (ref 12.0–15.0)
MCH: 32.8 pg (ref 26.0–34.0)
MCHC: 33.2 g/dL (ref 30.0–36.0)
MCV: 98.8 fL (ref 78.0–100.0)
PLATELETS: 278 10*3/uL (ref 150–400)
RBC: 4 MIL/uL (ref 3.87–5.11)
RDW: 13.4 % (ref 11.5–15.5)
WBC: 6.8 10*3/uL (ref 4.0–10.5)

## 2016-01-24 LAB — URINE MICROSCOPIC-ADD ON: WBC UA: NONE SEEN WBC/hpf (ref 0–5)

## 2016-01-24 LAB — PREGNANCY, URINE: Preg Test, Ur: NEGATIVE

## 2016-01-24 LAB — LIPASE, BLOOD: LIPASE: 28 U/L (ref 11–51)

## 2016-01-24 MED ORDER — ONDANSETRON 4 MG PO TBDP
4.0000 mg | ORAL_TABLET | Freq: Once | ORAL | Status: AC
  Administered 2016-01-24: 4 mg via ORAL
  Filled 2016-01-24: qty 1

## 2016-01-24 MED ORDER — HYDROMORPHONE HCL 1 MG/ML IJ SOLN
1.0000 mg | Freq: Once | INTRAMUSCULAR | Status: AC
  Administered 2016-01-24: 1 mg via INTRAMUSCULAR
  Filled 2016-01-24: qty 1

## 2016-01-24 MED ORDER — IBUPROFEN 800 MG PO TABS: 800.0000 mg | ORAL_TABLET | Freq: Three times a day (TID) | ORAL | 0 refills | Status: DC | PRN

## 2016-01-24 NOTE — Discharge Instructions (Addendum)
Follow up with dr. Ferguson in 1 week °

## 2016-01-24 NOTE — ED Provider Notes (Signed)
[facility] Provider Note   CSN: [id number] Arrival date & time: 01/24/16  0751     History   Chief Complaint Chief Complaint  Patient presents with  . Abdominal Pain    HPI Isabel Blackwell is a 20 y.o. female.  Patient complains of left lower quadrant abdominal pain for a few days   The history is provided by the patient. No language interpreter was used.  Abdominal Pain   This is a new problem. The current episode started more than 2 days ago. The problem occurs constantly. The problem has not changed since onset.The pain is associated with an unknown factor. The pain is located in the LLQ. Pertinent negatives include diarrhea, frequency, hematuria and headaches.    Past Medical History:  Diagnosis Date  . Depression   . Nasal congestion 12/04/2012  . Tonsillar and adenoid hypertrophy 11/2012   snores during sleep, mother denies apnea    Patient Active Problem List   Diagnosis Date Noted  . Severe episode of recurrent major depressive disorder, without psychotic features (HCC)   . MDD (major depressive disorder), recurrent episode, severe (HCC) 12/05/2014  . AP (abdominal pain)   . Abdominal pain 03/25/2014  . Rectal bleeding 03/25/2014  . Insomnia 03/09/2014  . Loss of weight 03/09/2014  . Depression 03/09/2014  . Neuropathy (HCC) 03/05/2012    Past Surgical History:  Procedure Laterality Date  . COLONOSCOPY N/A 04/13/2014   BX NL-ILEUM, COLON, RECTUM  . TONSILLECTOMY    . TONSILLECTOMY AND ADENOIDECTOMY Bilateral 12/09/2012   Procedure: BILATERAL TONSILLECTOMY AND ADENOIDECTOMY;  Surgeon: Darletta MollSui W Teoh, MD;  Location: [facility];  Service: ENT;  Laterality: Bilateral;  . WISDOM TOOTH EXTRACTION      OB History    Gravida Para Term Preterm AB Living   1 0 0 0 0 0   SAB TAB Ectopic Multiple Live Births   0 0 0 0         Home Medications    Prior to Admission medications   Medication Sig Start Date End Date Taking? Authorizing  Provider  ibuprofen (ADVIL,MOTRIN) 800 MG tablet Take 1 tablet (800 mg total) by mouth every 8 (eight) hours as needed for moderate pain. 01/24/16   Bethann BerkshireJoseph Madelein Mahadeo, MD  metroNIDAZOLE (FLAGYL) 500 MG tablet Take 1 tablet (500 mg total) by mouth 2 (two) times daily. One po bid x 7 days Patient not taking: Reported on 01/24/2016 10/03/15   Bethann BerkshireJoseph Trynity Skousen, MD    Family History Family History  Problem Relation Age of Onset  . Hypertension Mother   . Heart disease Maternal Grandfather   . Colon cancer Neg Hx     Social History Social History  Substance Use Topics  . Smoking status: Current Every Day Smoker    Packs/day: 0.50    Types: Cigarettes  . Smokeless tobacco: Never Used  . Alcohol use Yes     Comment: "every now and then"     Allergies   Nitrofurantoin monohyd macro   Review of Systems Review of Systems  Constitutional: Negative for appetite change and fatigue.  HENT: Negative for congestion, ear discharge and sinus pressure.   Eyes: Negative for discharge.  Respiratory: Negative for cough.   Cardiovascular: Negative for chest pain.  Gastrointestinal: Positive for abdominal pain. Negative for diarrhea.  Genitourinary: Negative for frequency and hematuria.  Musculoskeletal: Negative for back pain.  Skin: Negative for rash.  Neurological: Negative for seizures and headaches.  Psychiatric/Behavioral: Negative for hallucinations.  Physical Exam Updated Vital Signs BP 95/63   Pulse (!) 57   Temp 97.9 F (36.6 C) (Oral)   Resp 16   Ht 5\' 2"  (1.575 m)   Wt 105 lb (47.6 kg)   LMP 08/28/2015   SpO2 100%   BMI 19.20 kg/m   Physical Exam  Constitutional: She is oriented to person, place, and time. She appears well-developed.  HENT:  Head: Normocephalic.  Eyes: Conjunctivae and EOM are normal. No scleral icterus.  Neck: Neck supple. No thyromegaly present.  Cardiovascular: Normal rate and regular rhythm.  Exam reveals no gallop and no friction rub.   No murmur  heard. Pulmonary/Chest: No stridor. She has no wheezes. She has no rales. She exhibits no tenderness.  Abdominal: She exhibits no distension. There is tenderness. There is no rebound.  Mild left lower quadrant tenderness  Genitourinary:  Genitourinary Comments: Mild tenderness left adnexal area  Musculoskeletal: Normal range of motion. She exhibits no edema.  Lymphadenopathy:    She has no cervical adenopathy.  Neurological: She is oriented to person, place, and time. She exhibits normal muscle tone. Coordination normal.  Skin: No rash noted. No erythema.  Psychiatric: She has a normal mood and affect. Her behavior is normal.     ED Treatments / Results  Labs (all labs ordered are listed, but only abnormal results are displayed) Labs Reviewed  COMPREHENSIVE METABOLIC PANEL - Abnormal; Notable for the following:       Result Value   Potassium 3.4 (*)    All other components within normal limits  URINALYSIS, ROUTINE W REFLEX MICROSCOPIC (NOT AT [facility]) - Abnormal; Notable for the following:    Specific Gravity, Urine >1.030 (*)    Hgb urine dipstick TRACE (*)    All other components within normal limits  URINE MICROSCOPIC-ADD ON - Abnormal; Notable for the following:    Squamous Epithelial / LPF 0-5 (*)    Bacteria, UA RARE (*)    All other components within normal limits  LIPASE, BLOOD  CBC  PREGNANCY, URINE  GC/CHLAMYDIA PROBE AMP ([place]) NOT AT [facility]    EKG  EKG Interpretation None       Radiology No results found.  Procedures Procedures (including critical care time)  Medications Ordered in ED Medications  HYDROmorphone (DILAUDID) injection 1 mg (1 mg Intramuscular Given 01/24/16 0918)  ondansetron (ZOFRAN-ODT) disintegrating tablet 4 mg (4 mg Oral Given 01/24/16 [id number])     Initial Impression / Assessment and Plan / ED Course  I have reviewed the triage vital signs and the nursing notes.  Pertinent labs & imaging results that were available during my care  of the patient were reviewed by me and considered in my medical decision making (see chart for details).  Clinical Course     Pelvic pain abdominal pain. Patient to have a pelvic ultrasound and will refer to GYN  Final Clinical Impressions(s) / ED Diagnoses   Final diagnoses:  Pain  Lower abdominal pain    New Prescriptions New Prescriptions   IBUPROFEN (ADVIL,MOTRIN) 800 MG TABLET    Take 1 tablet (800 mg total) by mouth every 8 (eight) hours as needed for moderate pain.     Bethann BerkshireJoseph Franco Duley, MD 01/24/16 [phone]

## 2016-01-24 NOTE — ED Triage Notes (Signed)
Pt reports mid abdominal pain that started this morning with some nausea. Denies vomiting, diarrhea, fever. Pain comes in wave like patterns.

## 2016-01-25 LAB — GC/CHLAMYDIA PROBE AMP (~~LOC~~) NOT AT ARMC
Chlamydia: NEGATIVE
Neisseria Gonorrhea: NEGATIVE

## 2016-02-02 ENCOUNTER — Encounter: Payer: Self-pay | Admitting: Advanced Practice Midwife

## 2016-02-02 ENCOUNTER — Ambulatory Visit (INDEPENDENT_AMBULATORY_CARE_PROVIDER_SITE_OTHER): Payer: Medicaid Other | Admitting: Advanced Practice Midwife

## 2016-02-02 VITALS — BP 120/80 | HR 72 | Ht 62.0 in | Wt 107.0 lb

## 2016-02-02 DIAGNOSIS — N83202 Unspecified ovarian cyst, left side: Secondary | ICD-10-CM

## 2016-02-02 DIAGNOSIS — N83209 Unspecified ovarian cyst, unspecified side: Secondary | ICD-10-CM

## 2016-02-02 DIAGNOSIS — R1032 Left lower quadrant pain: Secondary | ICD-10-CM

## 2016-02-02 NOTE — Progress Notes (Signed)
   [facility] Visit  Patient name: Isabel Blackwell MRN [id number]  Date of birth: 06/27/1995  CC & HPI:  Isabel RoBrionna L Hamil is a 20 y.o. African American female presenting today for F/U from ED 12/5. She went w/LLQ pain that she has had about q 3 months for 3 years.  She was on Depo and then Nexplanon until early this summer. Was dx w/ruptured ovarian cyst. US showed multiple follicles, ? PCOS. However, pt has had monthly periods since removing Nexplanon, so unlikely  Pt did internet search and confused PCOS w/ ovarian cyst, thinks she cant get pregnant.  Doesn't want to be on [facility] for a while because she has been on it since age 20.  Discussed Plan B, usese condoms "every time".  Feels better now, no pain .  Pertinent History Reviewed:  Medical & Surgical Hx:   Past Medical History:  Diagnosis Date  . Depression   . Nasal congestion 12/04/2012  . Tonsillar and adenoid hypertrophy 11/2012   snores during sleep, mother denies apnea   Past Surgical History:  Procedure Laterality Date  . COLONOSCOPY N/A 04/13/2014   BX NL-ILEUM, COLON, RECTUM  . TONSILLECTOMY    . TONSILLECTOMY AND ADENOIDECTOMY Bilateral 12/09/2012   Procedure: BILATERAL TONSILLECTOMY AND ADENOIDECTOMY;  Surgeon: Darletta MollSui W Teoh, MD;  Location: [facility];  Service: ENT;  Laterality: Bilateral;  . WISDOM TOOTH EXTRACTION     Family History  Problem Relation Age of Onset  . Hypertension Mother   . Heart disease Maternal Grandfather   . Colon cancer Neg Hx     Current Outpatient Prescriptions:  .  ibuprofen (ADVIL,MOTRIN) 800 MG tablet, Take 1 tablet (800 mg total) by mouth every 8 (eight) hours as needed for moderate pain., Disp: 20 tablet, Rfl: 0 Social History: Reviewed -  reports that she has been smoking Cigarettes.  She has been smoking about 0.50 packs per day. She has never used smokeless tobacco.  Review of Systems:   Constitutional: Negative for fever and chills Eyes: Negative for visual  disturbances Respiratory: Negative for shortness of breath, dyspnea Cardiovascular: Negative for chest pain or palpitations  Gastrointestinal: Negative for vomiting, diarrhea and constipation; no abdominal pain Genitourinary: Negative for dysuria and urgency, vaginal irritation or itching Musculoskeletal: Negative for back pain, joint pain, myalgias  Neurological: Negative for dizziness and headaches    Objective Findings:    Physical Examination: General appearance - well appearing, and in no distress Mental status - alert, oriented to person, place, and time Chest:  Normal respiratory effort Heart - normal rate and regular rhythm Abdomen:  Soft, nontender Musculoskeletal:  Normal range of motion without pain Extremities:  No edema    No results found for this or any previous visit (from the past 24 hour(s)).    Assessment & Plan:  A:   Ruptured ovarian cyst, resolved P:  Long discussion about PCOS/ovarian cysts/Birth control.  Will stay off for 'a while", call me if she wants to restart.  Reassured taht ovarian cysts do not cause infertility.  /\\\50% or more of this visit was spent in counseling and coordination of care.  20 minutes of face to face time.    Return if symptoms worsen or fail to improve.  CRESENZO-DISHMAN,Gracee Ratterree CNM 02/02/2016 4:40 PM

## 2016-03-16 ENCOUNTER — Emergency Department (HOSPITAL_COMMUNITY)
Admission: EM | Admit: 2016-03-16 | Discharge: 2016-03-16 | Disposition: A | Discharge status: Home / Self Care | Payer: Medicaid Other | Attending: Dermatology | Admitting: Dermatology

## 2016-03-16 ENCOUNTER — Encounter (HOSPITAL_COMMUNITY): Payer: Self-pay | Admitting: *Deleted

## 2016-03-16 DIAGNOSIS — F17210 Nicotine dependence, cigarettes, uncomplicated: Secondary | ICD-10-CM | POA: Insufficient documentation

## 2016-03-16 DIAGNOSIS — R112 Nausea with vomiting, unspecified: Secondary | ICD-10-CM

## 2016-03-16 DIAGNOSIS — Z791 Long term (current) use of non-steroidal anti-inflammatories (NSAID): Secondary | ICD-10-CM | POA: Diagnosis not present

## 2016-03-16 DIAGNOSIS — Z5321 Procedure and treatment not carried out due to patient leaving prior to being seen by health care provider: Secondary | ICD-10-CM | POA: Insufficient documentation

## 2016-03-16 DIAGNOSIS — R197 Diarrhea, unspecified: Secondary | ICD-10-CM

## 2016-03-16 LAB — URINALYSIS, ROUTINE W REFLEX MICROSCOPIC
Bilirubin Urine: NEGATIVE
GLUCOSE, UA: NEGATIVE mg/dL
KETONES UR: NEGATIVE mg/dL
LEUKOCYTES UA: NEGATIVE
Nitrite: NEGATIVE
PROTEIN: NEGATIVE mg/dL
Specific Gravity, Urine: 1.025 (ref 1.005–1.030)
pH: 5 (ref 5.0–8.0)

## 2016-03-16 LAB — COMPREHENSIVE METABOLIC PANEL
ALT: 18 U/L (ref 14–54)
ANION GAP: 7 (ref 5–15)
AST: 21 U/L (ref 15–41)
Albumin: 4.7 g/dL (ref 3.5–5.0)
Alkaline Phosphatase: 46 U/L (ref 38–126)
BUN: 15 mg/dL (ref 6–20)
CALCIUM: 9.4 mg/dL (ref 8.9–10.3)
CHLORIDE: 105 mmol/L (ref 101–111)
CO2: 24 mmol/L (ref 22–32)
Creatinine, Ser: 0.61 mg/dL (ref 0.44–1.00)
Glucose, Bld: 99 mg/dL (ref 65–99)
Potassium: 4.2 mmol/L (ref 3.5–5.1)
SODIUM: 136 mmol/L (ref 135–145)
Total Bilirubin: 0.9 mg/dL (ref 0.3–1.2)
Total Protein: 8.1 g/dL (ref 6.5–8.1)

## 2016-03-16 LAB — PREGNANCY, URINE: PREG TEST UR: NEGATIVE

## 2016-03-16 LAB — CBC
HCT: 41.9 % (ref 36.0–46.0)
HEMOGLOBIN: 14.2 g/dL (ref 12.0–15.0)
MCH: 32.5 pg (ref 26.0–34.0)
MCHC: 33.9 g/dL (ref 30.0–36.0)
MCV: 95.9 fL (ref 78.0–100.0)
Platelets: 317 10*3/uL (ref 150–400)
RBC: 4.37 MIL/uL (ref 3.87–5.11)
RDW: 12.6 % (ref 11.5–15.5)
WBC: 8.5 10*3/uL (ref 4.0–10.5)

## 2016-03-16 LAB — LIPASE, BLOOD: LIPASE: 29 U/L (ref 11–51)

## 2016-03-16 NOTE — ED Triage Notes (Signed)
Pt comes in with n/v/d starting this morning. Pt having abdominal pain as well. NAD noted

## 2016-03-16 NOTE — ED Notes (Signed)
Pt no longer in the waiting room

## 2016-03-16 NOTE — ED Notes (Signed)
Pt called to room with no answer  

## 2016-03-17 ENCOUNTER — Emergency Department (HOSPITAL_COMMUNITY)
Admission: EM | Admit: 2016-03-17 | Discharge: 2016-03-17 | Disposition: A | Discharge status: Home / Self Care | Payer: Medicaid Other | Attending: Emergency Medicine | Admitting: Emergency Medicine

## 2016-03-17 ENCOUNTER — Encounter (HOSPITAL_COMMUNITY): Payer: Self-pay | Admitting: Emergency Medicine

## 2016-03-17 DIAGNOSIS — F17210 Nicotine dependence, cigarettes, uncomplicated: Secondary | ICD-10-CM | POA: Insufficient documentation

## 2016-03-17 DIAGNOSIS — R197 Diarrhea, unspecified: Secondary | ICD-10-CM | POA: Insufficient documentation

## 2016-03-17 DIAGNOSIS — R112 Nausea with vomiting, unspecified: Secondary | ICD-10-CM | POA: Diagnosis not present

## 2016-03-17 MED ORDER — ONDANSETRON HCL 8 MG PO TABS: 8.0000 mg | ORAL_TABLET | Freq: Three times a day (TID) | ORAL | 0 refills | Status: DC | PRN

## 2016-03-17 NOTE — ED Provider Notes (Signed)
Patient left the ED prior to being evaluated by myself.   Alona BeneJoshua Long, MD   Maia PlanJoshua G Long, MD 03/17/16 [phone]

## 2016-03-17 NOTE — Discharge Instructions (Signed)
Start with a clear liquid diet, then gradually advance to regular foods over 2 or 3 days.  You can use Imodium if needed for diarrhea.

## 2016-03-17 NOTE — ED Provider Notes (Signed)
[facility] Provider Note   CSN: [id number] Arrival date & time: 03/17/16  1114     History   Chief Complaint Chief Complaint  Patient presents with  . Influenza    HPI Isabel Blackwell is a 21 y.o. female.  She presents for evaluation of nausea, vomiting, diarrhea, which started yesterday. She came to the ED yesterday but left before evaluation because of stated abdominal pain that made it hard to sit and wait. Ports abdominal pain is improving, but she continues to have frequent episodes of vomiting and diarrhea. There's been no blood in either. She denies cough, nasal drainage, sore throat, this or dizziness. There are no other known modifying factors.  HPI  Past Medical History:  Diagnosis Date  . Depression   . Nasal congestion 12/04/2012  . Tonsillar and adenoid hypertrophy 11/2012   snores during sleep, mother denies apnea    Patient Active Problem List   Diagnosis Date Noted  . Severe episode of recurrent major depressive disorder, without psychotic features (HCC)   . MDD (major depressive disorder), recurrent episode, severe (HCC) 12/05/2014  . AP (abdominal pain)   . Abdominal pain 03/25/2014  . Rectal bleeding 03/25/2014  . Insomnia 03/09/2014  . Loss of weight 03/09/2014  . Depression 03/09/2014  . Neuropathy (HCC) 03/05/2012    Past Surgical History:  Procedure Laterality Date  . COLONOSCOPY N/A 04/13/2014   BX NL-ILEUM, COLON, RECTUM  . TONSILLECTOMY    . TONSILLECTOMY AND ADENOIDECTOMY Bilateral 12/09/2012   Procedure: BILATERAL TONSILLECTOMY AND ADENOIDECTOMY;  Surgeon: Darletta Moll, MD;  Location: [facility];  Service: ENT;  Laterality: Bilateral;  . WISDOM TOOTH EXTRACTION      OB History    Gravida Para Term Preterm AB Living   1 0 0 0 0 0   SAB TAB Ectopic Multiple Live Births   0 0 0 0         Home Medications    Prior to Admission medications   Medication Sig Start Date End Date Taking? Authorizing Provider    ibuprofen (ADVIL,MOTRIN) 800 MG tablet Take 1 tablet (800 mg total) by mouth every 8 (eight) hours as needed for moderate pain. 01/24/16   Bethann Berkshire, MD  ondansetron (ZOFRAN) 8 MG tablet Take 1 tablet (8 mg total) by mouth every 8 (eight) hours as needed for nausea or vomiting. 03/17/16   Mancel Bale, MD    Family History Family History  Problem Relation Age of Onset  . Hypertension Mother   . Heart disease Maternal Grandfather   . Colon cancer Neg Hx     Social History Social History  Substance Use Topics  . Smoking status: Current Every Day Smoker    Packs/day: 0.50    Types: Cigarettes  . Smokeless tobacco: Never Used  . Alcohol use Yes     Comment: "every now and then"     Allergies   Nitrofurantoin monohyd macro   Review of Systems Review of Systems  All other systems reviewed and are negative.    Physical Exam Updated Vital Signs BP 131/95 (BP Location: Right Arm)   Pulse 82   Temp 98.4 F (36.9 C) (Oral)   Resp 20   Ht 5\' 2"  (1.575 m)   Wt 107 lb (48.5 kg)   LMP 02/10/2016   SpO2 98%   BMI 19.57 kg/m   Physical Exam  Constitutional: She is oriented to person, place, and time. She appears well-developed and well-nourished. No distress.  HENT:  Head: Normocephalic and atraumatic.  Eyes: Conjunctivae and EOM are normal. Pupils are equal, round, and reactive to light.  Neck: Normal range of motion and phonation normal. Neck supple.  Cardiovascular: Normal rate and regular rhythm.   Pulmonary/Chest: Effort normal and breath sounds normal. She exhibits no tenderness.  Abdominal: Soft. She exhibits no distension. There is no tenderness. There is no guarding.  Musculoskeletal: Normal range of motion.  Neurological: She is alert and oriented to person, place, and time. She exhibits normal muscle tone.  Skin: Skin is warm and dry.  Psychiatric: She has a normal mood and affect. Her behavior is normal. Judgment and thought content normal.  Nursing note  and vitals reviewed.    ED Treatments / Results  Labs (all labs ordered are listed, but only abnormal results are displayed) Labs Reviewed - No data to display  EKG  EKG Interpretation None       Radiology No results found.  Procedures Procedures (including critical care time)  Medications Ordered in ED Medications - No data to display   Initial Impression / Assessment and Plan / ED Course  I have reviewed the triage vital signs and the nursing notes.  Pertinent labs & imaging results that were available during my care of the patient were reviewed by me and considered in my medical decision making (see chart for details).     Medications - No data to display  Patient Vitals for the past 24 hrs:  BP Temp Temp src Pulse Resp SpO2 Height Weight  03/17/16 1128 - - - - - - 5\' 2"  (1.575 m) 107 lb (48.5 kg)  03/17/16 1127 131/95 98.4 F (36.9 C) Oral 82 20 98 % - -    12:11 PM Reevaluation with update and discussion. After initial assessment and treatment, an updated evaluation reveals No further complaints. Findings discussed with patient and all questions answered. Rayya Yagi L    Final Clinical Impressions(s) / ED Diagnoses   Final diagnoses:  Nausea vomiting and diarrhea    Evaluation consistent with infectious nausea, vomiting and diarrhea. Doubt metabolic instability or impending vascular collapse.  Nursing Notes Reviewed/ Care Coordinated Applicable Imaging Reviewed Interpretation of Laboratory Data incorporated into ED treatment  The patient appears reasonably screened and/or stabilized for discharge and I doubt any other medical condition or other [facility] requiring further screening, evaluation, or treatment in the ED at this time prior to discharge.  Plan: Home Medications- Immodium prn; Home Treatments- gradually advance diet; return here if the recommended treatment, does not improve the symptoms; Recommended follow up- PCP prn   New  Prescriptions New Prescriptions   ONDANSETRON (ZOFRAN) 8 MG TABLET    Take 1 tablet (8 mg total) by mouth every 8 (eight) hours as needed for nausea or vomiting.     Mancel BaleElliott Erendida Wrenn, MD 03/17/16 1212

## 2016-04-18 ENCOUNTER — Encounter (HOSPITAL_COMMUNITY): Payer: Self-pay | Admitting: *Deleted

## 2016-04-18 ENCOUNTER — Emergency Department (HOSPITAL_COMMUNITY)
Admission: EM | Admit: 2016-04-18 | Discharge: 2016-04-19 | Disposition: A | Discharge status: Home / Self Care | Payer: Medicaid Other | Attending: Emergency Medicine | Admitting: Emergency Medicine

## 2016-04-18 DIAGNOSIS — F1290 Cannabis use, unspecified, uncomplicated: Secondary | ICD-10-CM | POA: Insufficient documentation

## 2016-04-18 DIAGNOSIS — M545 Low back pain, unspecified: Secondary | ICD-10-CM

## 2016-04-18 DIAGNOSIS — R1031 Right lower quadrant pain: Secondary | ICD-10-CM | POA: Diagnosis not present

## 2016-04-18 DIAGNOSIS — F17210 Nicotine dependence, cigarettes, uncomplicated: Secondary | ICD-10-CM | POA: Insufficient documentation

## 2016-04-18 LAB — CBC WITH DIFFERENTIAL/PLATELET
Basophils Absolute: 0 10*3/uL (ref 0.0–0.1)
Basophils Relative: 1 %
EOS ABS: 0.1 10*3/uL (ref 0.0–0.7)
EOS PCT: 1 %
HCT: 39.9 % (ref 36.0–46.0)
HEMOGLOBIN: 13.6 g/dL (ref 12.0–15.0)
LYMPHS ABS: 2.7 10*3/uL (ref 0.7–4.0)
LYMPHS PCT: 49 %
MCH: 32.4 pg (ref 26.0–34.0)
MCHC: 34.1 g/dL (ref 30.0–36.0)
MCV: 95 fL (ref 78.0–100.0)
MONOS PCT: 6 %
Monocytes Absolute: 0.3 10*3/uL (ref 0.1–1.0)
Neutro Abs: 2.4 10*3/uL (ref 1.7–7.7)
Neutrophils Relative %: 43 %
PLATELETS: 306 10*3/uL (ref 150–400)
RBC: 4.2 MIL/uL (ref 3.87–5.11)
RDW: 12.7 % (ref 11.5–15.5)
WBC: 5.5 10*3/uL (ref 4.0–10.5)

## 2016-04-18 LAB — URINALYSIS, ROUTINE W REFLEX MICROSCOPIC
BILIRUBIN URINE: NEGATIVE
GLUCOSE, UA: NEGATIVE mg/dL
HGB URINE DIPSTICK: NEGATIVE
KETONES UR: 20 mg/dL — AB
NITRITE: NEGATIVE
PH: 5 (ref 5.0–8.0)
Protein, ur: NEGATIVE mg/dL
SPECIFIC GRAVITY, URINE: 1.026 (ref 1.005–1.030)

## 2016-04-18 LAB — COMPREHENSIVE METABOLIC PANEL
ALK PHOS: 43 U/L (ref 38–126)
ALT: 13 U/L — ABNORMAL LOW (ref 14–54)
ANION GAP: 7 (ref 5–15)
AST: 21 U/L (ref 15–41)
Albumin: 4.4 g/dL (ref 3.5–5.0)
BUN: 9 mg/dL (ref 6–20)
CALCIUM: 8.7 mg/dL — AB (ref 8.9–10.3)
CO2: 25 mmol/L (ref 22–32)
CREATININE: 0.75 mg/dL (ref 0.44–1.00)
Chloride: 100 mmol/L — ABNORMAL LOW (ref 101–111)
Glucose, Bld: 94 mg/dL (ref 65–99)
Potassium: 3.2 mmol/L — ABNORMAL LOW (ref 3.5–5.1)
SODIUM: 132 mmol/L — AB (ref 135–145)
Total Bilirubin: 0.8 mg/dL (ref 0.3–1.2)
Total Protein: 7.7 g/dL (ref 6.5–8.1)

## 2016-04-18 LAB — PREGNANCY, URINE: Preg Test, Ur: NEGATIVE

## 2016-04-18 MED ORDER — IBUPROFEN 800 MG PO TABS: 800.0000 mg | ORAL_TABLET | Freq: Three times a day (TID) | ORAL | 0 refills | Status: DC | PRN

## 2016-04-18 NOTE — Discharge Instructions (Signed)

## 2016-04-18 NOTE — ED Triage Notes (Signed)
Pt c/o lower back pain and right lower abdominal pain for the last couple of days; pt states she has been nauseous, denies any urinary sx

## 2016-04-18 NOTE — ED Provider Notes (Signed)
Emergency Department Provider Note   I have reviewed the triage vital signs and the nursing notes.  By signing my name below, I, Clarisse Gouge, attest that this documentation has been prepared under the direction and in the presence of Maia Plan, MD. Electronically signed, Clarisse Gouge, ED Scribe. 04/18/16. 10:39 PM.   HISTORY  Chief Complaint Back Pain  HPI Comments: Isabel Blackwell is a 21 y.o. female who presents to the Emergency Department complaining of intermittent R flank pain x 3-4 days. She describes the pain as stabbing moderate pain. She notes associated RLQ pain and nausea. She states she has taken Advil at home without relief. She adds Hx of ovarian cysts on the right side and she does not believe that her current pain is related to it. Pt denies dysuria, hematuria, chest pain, SOB, vaginal bleeding, vaginal discharge, fever, weakness, numbness, Hx of similar, injury or Hx of abdominal surgeries. Pt not currently on birth control. LNMP 04/10/2016.   Past Medical History:  Diagnosis Date  . Depression   . Nasal congestion 12/04/2012  . Tonsillar and adenoid hypertrophy 11/2012   snores during sleep, mother denies apnea    Patient Active Problem List   Diagnosis Date Noted  . Severe episode of recurrent major depressive disorder, without psychotic features (HCC)   . MDD (major depressive disorder), recurrent episode, severe (HCC) 12/05/2014  . AP (abdominal pain)   . Abdominal pain 03/25/2014  . Rectal bleeding 03/25/2014  . Insomnia 03/09/2014  . Loss of weight 03/09/2014  . Depression 03/09/2014  . Neuropathy (HCC) 03/05/2012    Past Surgical History:  Procedure Laterality Date  . COLONOSCOPY N/A 04/13/2014   BX NL-ILEUM, COLON, RECTUM  . TONSILLECTOMY    . TONSILLECTOMY AND ADENOIDECTOMY Bilateral 12/09/2012   Procedure: BILATERAL TONSILLECTOMY AND ADENOIDECTOMY;  Surgeon: Darletta Moll, MD;  Location: [facility];  Service: ENT;   Laterality: Bilateral;  . WISDOM TOOTH EXTRACTION      Current Outpatient Rx  . Order #: [id number] Class: Print    Allergies Nitrofurantoin monohyd macro  Family History  Problem Relation Age of Onset  . Hypertension Mother   . Heart disease Maternal Grandfather   . Colon cancer Neg Hx     Social History Social History  Substance Use Topics  . Smoking status: Current Every Day Smoker    Packs/day: 0.50    Types: Cigarettes  . Smokeless tobacco: Never Used  . Alcohol use Yes     Comment: "every now and then"    Review of Systems  Constitutional: No fever/chills Eyes: No visual changes. ENT: No sore throat. Cardiovascular: Denies chest pain. Respiratory: Denies shortness of breath. Gastrointestinal: No abdominal pain.  No nausea, no vomiting.  No diarrhea.  No constipation. Genitourinary: Negative for dysuria. Musculoskeletal: Positive for back pain and and flank pain.  Skin: Negative for rash. Neurological: Negative for headaches, focal weakness or numbness.  10-point ROS otherwise negative.  ____________________________________________   PHYSICAL EXAM:  VITAL SIGNS: ED Triage Vitals [04/18/16 2115]  Enc Vitals Group     BP 119/79     Pulse Rate 71     Resp 18     Temp 98.4 F (36.9 C)     Temp Source Oral     SpO2 100 %     Weight 105 lb (47.6 kg)     Height 5\' 2"  (1.575 m)     Pain Score 7   Constitutional: Alert and oriented. Well appearing  and in no acute distress. Eyes: Conjunctivae are normal.  Head: Atraumatic. Nose: No congestion/rhinnorhea. Mouth/Throat: Mucous membranes are moist.   Neck: No stridor.   Cardiovascular: Normal rate, regular rhythm. Good peripheral circulation. Grossly normal heart sounds.   Respiratory: Normal respiratory effort.  No retractions. Lungs CTAB. Gastrointestinal: Soft and nontender. No rebound or guarding. No masses. No distention. No CVA tenderness to percussion.  Musculoskeletal: No lower extremity  tenderness nor edema. No gross deformities of extremities. Neurologic:  Normal speech and language. No gross focal neurologic deficits are appreciated. Normal strength and sensation in B/L LEs.  Skin:  Skin is warm, dry and intact. No rash noted. Psychiatric: Mood and affect are normal. Speech and behavior are normal.  ____________________________________________   DIAGNOSTIC STUDIES: Oxygen Saturation is 100% on RA, normal by my interpretation.    COORDINATION OF CARE: 10:22 AM Discussed treatment plan with pt at bedside and pt agreed to plan. Will order medication, review labs and reassess.   LABS (all labs ordered are listed, but only abnormal results are displayed)  Labs Reviewed  COMPREHENSIVE METABOLIC PANEL - Abnormal; Notable for the following:       Result Value   Sodium 132 (*)    Potassium 3.2 (*)    Chloride 100 (*)    Calcium 8.7 (*)    ALT 13 (*)    All other components within normal limits  URINALYSIS, ROUTINE W REFLEX MICROSCOPIC - Abnormal; Notable for the following:    APPearance HAZY (*)    Ketones, ur 20 (*)    Leukocytes, UA TRACE (*)    Bacteria, UA RARE (*)    All other components within normal limits  CBC WITH DIFFERENTIAL/PLATELET  PREGNANCY, URINE   ____________________________________________  RADIOLOGY  None ____________________________________________   PROCEDURES  Procedure(s) performed:   Procedures  None ____________________________________________   INITIAL IMPRESSION / ASSESSMENT AND PLAN / ED COURSE  Pertinent labs & imaging results that were available during my care of the patient were reviewed by me and considered in my medical decision making (see chart for details).  Patient presents to the ED with right sided lower back pain and right flank pain. No tenderness to palpation of the RLQ or any other region of the abdomen. UA normal. Other labs pending. Patient has a history of ovarian cysts but very low suspicion for  torsion with only mild symptoms and those that are worse with movement.   Differential diagnosis includes but is not exclusive to ectopic pregnancy, ovarian cyst, ovarian torsion, acute appendicitis, urinary tract infection, endometriosis, bowel obstruction, hernia, colitis, renal colic, gastroenteritis, volvulus etc.   23:00 Patient with mild hyponatremia and hypokalemia. No indication for supplementation at this time. Discussed dietary changes and PCP follow up for repeat labs. Abdomen remains soft and completely non-tender. Discussed further f/u with Gyn for cysts and gave strict torsion return precautions but at this time the pain seems more MSK-related.   At this time, I do not feel there is any life-threatening condition present. I have reviewed and discussed all results (EKG, imaging, lab, urine as appropriate), exam findings with patient. I have reviewed nursing notes and appropriate previous records.  I feel the patient is safe to be discharged home without further emergent workup. Discussed usual and customary return precautions. Patient and family (if present) verbalize understanding and are comfortable with this plan.  Patient will follow-up with their primary care provider. If they do not have a primary care provider, information for follow-up has been provided  to them. All questions have been answered.  ____________________________________________  FINAL CLINICAL IMPRESSION(S) / ED DIAGNOSES  Final diagnoses:  Acute left-sided low back pain without sciatica     MEDICATIONS GIVEN DURING THIS VISIT:  None  NEW OUTPATIENT MEDICATIONS STARTED DURING THIS VISIT:  Motrin 800 mg PO Q8H PRN pain   Note:  This document was prepared using Dragon voice recognition software and may include unintentional dictation errors.  Alona Bene, MD Emergency Medicine   I personally performed the services described in this documentation, which was scribed in my presence. The recorded  information has been reviewed and is accurate.      Maia Plan, MD 04/19/16 [phone]

## 2016-05-28 ENCOUNTER — Encounter (HOSPITAL_COMMUNITY): Payer: Self-pay | Admitting: *Deleted

## 2016-05-28 ENCOUNTER — Emergency Department (HOSPITAL_COMMUNITY)
Admission: EM | Admit: 2016-05-28 | Discharge: 2016-05-28 | Disposition: A | Discharge status: Home / Self Care | Payer: Medicaid Other | Attending: Emergency Medicine | Admitting: Emergency Medicine

## 2016-05-28 DIAGNOSIS — R300 Dysuria: Secondary | ICD-10-CM | POA: Diagnosis present

## 2016-05-28 DIAGNOSIS — F17210 Nicotine dependence, cigarettes, uncomplicated: Secondary | ICD-10-CM | POA: Insufficient documentation

## 2016-05-28 DIAGNOSIS — Z791 Long term (current) use of non-steroidal anti-inflammatories (NSAID): Secondary | ICD-10-CM | POA: Diagnosis not present

## 2016-05-28 DIAGNOSIS — N3001 Acute cystitis with hematuria: Secondary | ICD-10-CM | POA: Diagnosis not present

## 2016-05-28 LAB — URINALYSIS, ROUTINE W REFLEX MICROSCOPIC
Glucose, UA: NEGATIVE mg/dL
Ketones, ur: NEGATIVE mg/dL
NITRITE: NEGATIVE
PROTEIN: 100 mg/dL — AB
SPECIFIC GRAVITY, URINE: 1.025 (ref 1.005–1.030)
pH: 7 (ref 5.0–8.0)

## 2016-05-28 LAB — URINALYSIS, MICROSCOPIC (REFLEX)

## 2016-05-28 LAB — PREGNANCY, URINE: PREG TEST UR: NEGATIVE

## 2016-05-28 MED ORDER — SULFAMETHOXAZOLE-TRIMETHOPRIM 800-160 MG PO TABS: 1.0000 | ORAL_TABLET | Freq: Two times a day (BID) | ORAL | 0 refills | Status: AC

## 2016-05-28 NOTE — ED Notes (Signed)
Called lab to add on pregnancy test.

## 2016-05-28 NOTE — Discharge Instructions (Signed)
Take antibiotics as discussed. See a physician if you're unable to keep antibiotics down, persistent vomiting, persistent fevers or new or worsening symptoms.  If you were given medicines take as directed.  If you are on coumadin or contraceptives realize their levels and effectiveness is altered by many different medicines.  If you have any reaction (rash, tongues swelling, other) to the medicines stop taking and see a physician.    If your blood pressure was elevated in the ER make sure you follow up for management with a primary doctor or return for chest pain, shortness of breath or stroke symptoms.  Please follow up as directed and return to the ER or see a physician for new or worsening symptoms.  Thank you. Vitals:   05/28/16 1024  BP: 119/90  Pulse: 87  Resp: 16  Temp: 97.9 F (36.6 C)  TempSrc: Oral  SpO2: 100%  Weight: 105 lb (47.6 kg)  Height:  (1.575 m)

## 2016-05-28 NOTE — ED Notes (Signed)
Pt made aware to return if symptoms worsen or if any life threatening symptoms occur.   

## 2016-05-28 NOTE — ED Triage Notes (Addendum)
Pt c/o intermittent dysuria, urinary frequency and urgency x several days, worsening this morning. Denies fever, vaginal discharge and itching. Pt went to get urine specimen in triage and pt came out reporting she had blood on the tissue when she wiped. P also c/o intermittent sharp pains to lower abdomen and bilateral flank areas. Denies nausea today, but has had nausea at times over the last several days.

## 2016-05-28 NOTE — ED Provider Notes (Signed)
[facility]-EMERGENCY DEPT Provider Note   CSN: [id number] Arrival date & time: 05/28/16  1003     History   Chief Complaint Chief Complaint  Patient presents with  . Dysuria    HPI Isabel Blackwell is a 21 y.o. female.  Patient presents with dysuria and urinary frequency and urgency for the past 2 days. No fevers. Mild left back ache. Patient is had nausea without vomiting. No history of kidney stones.      Past Medical History:  Diagnosis Date  . Depression   . Nasal congestion 12/04/2012  . Tonsillar and adenoid hypertrophy 11/2012   snores during sleep, mother denies apnea    Patient Active Problem List   Diagnosis Date Noted  . Severe episode of recurrent major depressive disorder, without psychotic features (HCC)   . MDD (major depressive disorder), recurrent episode, severe (HCC) 12/05/2014  . AP (abdominal pain)   . Abdominal pain 03/25/2014  . Rectal bleeding 03/25/2014  . Insomnia 03/09/2014  . Loss of weight 03/09/2014  . Depression 03/09/2014  . Neuropathy (HCC) 03/05/2012    Past Surgical History:  Procedure Laterality Date  . COLONOSCOPY N/A 04/13/2014   BX NL-ILEUM, COLON, RECTUM  . TONSILLECTOMY    . TONSILLECTOMY AND ADENOIDECTOMY Bilateral 12/09/2012   Procedure: BILATERAL TONSILLECTOMY AND ADENOIDECTOMY;  Surgeon: Darletta Moll, MD;  Location: [facility];  Service: ENT;  Laterality: Bilateral;  . WISDOM TOOTH EXTRACTION      OB History    Gravida Para Term Preterm AB Living   1 0 0 0 0 0   SAB TAB Ectopic Multiple Live Births   0 0 0 0         Home Medications    Prior to Admission medications   Medication Sig Start Date End Date Taking? Authorizing Provider  ibuprofen (ADVIL,MOTRIN) 800 MG tablet Take 1 tablet (800 mg total) by mouth every 8 (eight) hours as needed. 04/18/16  Yes Maia Plan, MD  sulfamethoxazole-trimethoprim (BACTRIM DS,SEPTRA DS) 800-160 MG tablet Take 1 tablet by mouth 2 (two) times daily. 05/28/16 06/04/16   Blane Ohara, MD    Family History Family History  Problem Relation Age of Onset  . Hypertension Mother   . Heart disease Maternal Grandfather   . Colon cancer Neg Hx     Social History Social History  Substance Use Topics  . Smoking status: Current Every Day Smoker    Packs/day: 0.50    Types: Cigarettes  . Smokeless tobacco: Never Used  . Alcohol use Yes     Comment: "every now and then"     Allergies   Nitrofurantoin monohyd macro   Review of Systems Review of Systems  Constitutional: Negative for chills and fever.  Gastrointestinal: Negative for abdominal pain and vomiting.  Genitourinary: Positive for dysuria, flank pain and frequency. Negative for pelvic pain.  Musculoskeletal: Negative for back pain, neck pain and neck stiffness.  Skin: Negative for rash.  Neurological: Negative for headaches.     Physical Exam Updated Vital Signs BP 119/90   Pulse 87   Temp 97.9 F (36.6 C) (Oral)   Resp 16   Ht  (1.575 m)   Wt 105 lb (47.6 kg)   LMP 05/06/2016   SpO2 100%   BMI 19.20 kg/m   Physical Exam  Constitutional: She is oriented to person, place, and time. She appears well-developed and well-nourished.  HENT:  Head: Normocephalic and atraumatic.  Eyes: Conjunctivae are normal. Right eye exhibits  no discharge. Left eye exhibits no discharge.  Neck: Normal range of motion. Neck supple. No tracheal deviation present.  Cardiovascular: Normal rate and regular rhythm.   Pulmonary/Chest: Effort normal and breath sounds normal.  Abdominal: Soft. She exhibits no distension. There is tenderness (mild suprapubic). There is no guarding.  Musculoskeletal: She exhibits no edema.  Neurological: She is alert and oriented to person, place, and time.  Skin: Skin is warm. No rash noted.  Psychiatric: She has a normal mood and affect.  Nursing note and vitals reviewed.    ED Treatments / Results  Labs (all labs ordered are listed, but only abnormal results are  displayed) Labs Reviewed  URINALYSIS, ROUTINE W REFLEX MICROSCOPIC - Abnormal; Notable for the following:       Result Value   APPearance TURBID (*)    Hgb urine dipstick LARGE (*)    Bilirubin Urine SMALL (*)    Protein, ur 100 (*)    Leukocytes, UA LARGE (*)    All other components within normal limits  URINALYSIS, MICROSCOPIC (REFLEX) - Abnormal; Notable for the following:    Bacteria, UA MANY (*)    Squamous Epithelial / LPF TOO NUMEROUS TO COUNT (*)    All other components within normal limits  PREGNANCY, URINE    EKG  EKG Interpretation None       Radiology No results found.  Procedures Procedures (including critical care time)  Medications Ordered in ED Medications - No data to display   Initial Impression / Assessment and Plan / ED Course  I have reviewed the triage vital signs and the nursing notes.  Pertinent labs & imaging results that were available during my care of the patient were reviewed by me and considered in my medical decision making (see chart for details).    Well-appearing patient presents with clinically cystitis and possible early pyelonephritis with mild flank pain. Discussed antibiotics and supportive care. Urinalysis pending.  Results and differential diagnosis were discussed with the patient/parent/guardian. Xrays were independently reviewed by myself.  Close follow up outpatient was discussed, comfortable with the plan.   Medications - No data to display  Vitals:   05/28/16 1024  BP: 119/90  Pulse: 87  Resp: 16  Temp: 97.9 F (36.6 C)  TempSrc: Oral  SpO2: 100%  Weight: 105 lb (47.6 kg)  Height:  (1.575 m)    Final diagnoses:  Dysuria  Acute cystitis with hematuria     Final Clinical Impressions(s) / ED Diagnoses   Final diagnoses:  Dysuria  Acute cystitis with hematuria    New Prescriptions New Prescriptions   SULFAMETHOXAZOLE-TRIMETHOPRIM (BACTRIM DS,SEPTRA DS) 800-160 MG TABLET    Take 1 tablet by  mouth 2 (two) times daily.     Blane Ohara, MD 05/28/16 [phone]

## 2016-06-13 ENCOUNTER — Encounter (HOSPITAL_COMMUNITY): Payer: Self-pay | Admitting: Cardiology

## 2016-06-13 ENCOUNTER — Emergency Department (HOSPITAL_COMMUNITY)
Admission: EM | Admit: 2016-06-13 | Discharge: 2016-06-13 | Disposition: A | Discharge status: Home / Self Care | Payer: Medicaid Other | Attending: Emergency Medicine | Admitting: Emergency Medicine

## 2016-06-13 DIAGNOSIS — F1290 Cannabis use, unspecified, uncomplicated: Secondary | ICD-10-CM | POA: Diagnosis not present

## 2016-06-13 DIAGNOSIS — F17210 Nicotine dependence, cigarettes, uncomplicated: Secondary | ICD-10-CM | POA: Insufficient documentation

## 2016-06-13 DIAGNOSIS — N390 Urinary tract infection, site not specified: Secondary | ICD-10-CM | POA: Insufficient documentation

## 2016-06-13 DIAGNOSIS — R1033 Periumbilical pain: Secondary | ICD-10-CM | POA: Diagnosis present

## 2016-06-13 DIAGNOSIS — R197 Diarrhea, unspecified: Secondary | ICD-10-CM | POA: Insufficient documentation

## 2016-06-13 LAB — COMPREHENSIVE METABOLIC PANEL
ALBUMIN: 4.4 g/dL (ref 3.5–5.0)
ALT: 19 U/L (ref 14–54)
AST: 26 U/L (ref 15–41)
Alkaline Phosphatase: 48 U/L (ref 38–126)
Anion gap: 8 (ref 5–15)
BILIRUBIN TOTAL: 0.9 mg/dL (ref 0.3–1.2)
BUN: 14 mg/dL (ref 6–20)
CALCIUM: 9.3 mg/dL (ref 8.9–10.3)
CO2: 26 mmol/L (ref 22–32)
Chloride: 100 mmol/L — ABNORMAL LOW (ref 101–111)
Creatinine, Ser: 0.84 mg/dL (ref 0.44–1.00)
GFR calc Af Amer: >60 mL/min (ref >60)
GLUCOSE: 103 mg/dL — AB (ref 65–99)
Potassium: 4.1 mmol/L (ref 3.5–5.1)
Sodium: 134 mmol/L — ABNORMAL LOW (ref 135–145)
TOTAL PROTEIN: 7.1 g/dL (ref 6.5–8.1)

## 2016-06-13 LAB — URINALYSIS, ROUTINE W REFLEX MICROSCOPIC
Bilirubin Urine: NEGATIVE
Glucose, UA: NEGATIVE mg/dL
Hgb urine dipstick: NEGATIVE
Ketones, ur: 20 mg/dL — AB
Leukocytes, UA: NEGATIVE
Nitrite: NEGATIVE
PROTEIN: 30 mg/dL — AB
SPECIFIC GRAVITY, URINE: 1.026 (ref 1.005–1.030)
pH: 7 (ref 5.0–8.0)

## 2016-06-13 LAB — CBC WITH DIFFERENTIAL/PLATELET
BASOS PCT: 0 %
Basophils Absolute: 0 10*3/uL (ref 0.0–0.1)
Eosinophils Absolute: 0 10*3/uL (ref 0.0–0.7)
Eosinophils Relative: 0 %
HEMATOCRIT: 39.8 % (ref 36.0–46.0)
HEMOGLOBIN: 13.3 g/dL (ref 12.0–15.0)
LYMPHS PCT: 6 %
Lymphs Abs: 0.4 10*3/uL — ABNORMAL LOW (ref 0.7–4.0)
MCH: 32.2 pg (ref 26.0–34.0)
MCHC: 33.4 g/dL (ref 30.0–36.0)
MCV: 96.4 fL (ref 78.0–100.0)
MONO ABS: 0.4 10*3/uL (ref 0.1–1.0)
MONOS PCT: 5 %
NEUTROS ABS: 7 10*3/uL (ref 1.7–7.7)
NEUTROS PCT: 89 %
Platelets: 269 10*3/uL (ref 150–400)
RBC: 4.13 MIL/uL (ref 3.87–5.11)
RDW: 13 % (ref 11.5–15.5)
WBC: 7.9 10*3/uL (ref 4.0–10.5)

## 2016-06-13 LAB — LIPASE, BLOOD: LIPASE: 20 U/L (ref 11–51)

## 2016-06-13 LAB — PREGNANCY, URINE: PREG TEST UR: NEGATIVE

## 2016-06-13 MED ORDER — ONDANSETRON HCL 4 MG PO TABS: 4.0000 mg | ORAL_TABLET | Freq: Three times a day (TID) | ORAL | 0 refills | Status: DC | PRN

## 2016-06-13 MED ORDER — CEPHALEXIN 500 MG PO CAPS: 500.0000 mg | ORAL_CAPSULE | Freq: Four times a day (QID) | ORAL | 0 refills | Status: DC

## 2016-06-13 MED ORDER — IBUPROFEN 800 MG PO TABS: 400.0000 mg | ORAL_TABLET | Freq: Three times a day (TID) | ORAL | 0 refills | Status: DC | PRN

## 2016-06-13 MED ORDER — CEPHALEXIN 500 MG PO CAPS
500.0000 mg | ORAL_CAPSULE | Freq: Once | ORAL | Status: AC
  Administered 2016-06-13: 500 mg via ORAL
  Filled 2016-06-13: qty 1

## 2016-06-13 MED ORDER — SODIUM CHLORIDE 0.9 % IV BOLUS (SEPSIS)
1000.0000 mL | Freq: Once | INTRAVENOUS | Status: AC
  Administered 2016-06-13: 1000 mL via INTRAVENOUS

## 2016-06-13 MED ORDER — ONDANSETRON HCL 4 MG/2ML IJ SOLN
4.0000 mg | Freq: Once | INTRAMUSCULAR | Status: AC
  Administered 2016-06-13: 4 mg via INTRAVENOUS
  Filled 2016-06-13: qty 2

## 2016-06-13 NOTE — ED Triage Notes (Signed)
Lower abdominal pain,  Vomiting and diarrhea since last night

## 2016-06-13 NOTE — ED Provider Notes (Signed)
[facility]-EMER[facility]PT Provider Note   CSN: [id number] Arrival date & time: 06/13/16  1241  By signing my name below, I, Majel Homer, attest that this documentation has been prepared under the direction and in the presence of Marily Memos, MD . Electronically Signed: Majel Homer, Scribe. 06/13/2016. 1:14 PM.  History   Chief Complaint Chief Complaint  Patient presents with  . Abdominal Pain   The history is provided by the patient. No language interpreter was used.   HPI Comments: Isabel Blackwell is a 21 y.o. female who presents to the Emergency Department complaining of gradually worsening, periumbilical abdominal pain that began at ~2:00 AM this morning. Pt reports she ate at [employer] at ~2:00 AM this morning and began to experience sudden abdominal "tightening," nausea, and vomiting soon after. She states ~10 episodes of non-bilious vomiting followed by 2 episodes of diarrhea. She notes she has not taken any medication to relieve her pain. Pt denies any sick contacts with similar symptoms, fever, dysuria, hematuria, and recent use of marijuana or EtOH.   Past Medical History:  Diagnosis Date  . Depression   . Nasal congestion 12/04/2012  . Tonsillar and adenoid hypertrophy 11/2012   snores during sleep, mother denies apnea   Patient Active Problem List   Diagnosis Date Noted  . Severe episode of recurrent major depressive disorder, without psychotic features (HCC)   . MDD (major depressive disorder), recurrent episode, severe (HCC) 12/05/2014  . AP (abdominal pain)   . Abdominal pain 03/25/2014  . Rectal bleeding 03/25/2014  . Insomnia 03/09/2014  . Loss of weight 03/09/2014  . Depression 03/09/2014  . Neuropathy 03/05/2012   Past Surgical History:  Procedure Laterality Date  . COLONOSCOPY N/A 04/13/2014   BX NL-ILEUM, COLON, RECTUM  . TONSILLECTOMY    . TONSILLECTOMY AND ADENOIDECTOMY Bilateral 12/09/2012   Procedure: BILATERAL TONSILLECTOMY AND ADENOIDECTOMY;  Surgeon: Darletta Moll, MD;  [facility];  Service: ENT;  Laterality: Bilateral;  . WISDOM TOOTH EXTRACTION     OB History    Gravida Para Term Preterm AB Living   1 0 0 0 0 0   SAB TAB Ectopic Multiple Live Births   0 0 0 0       Home Medications    Prior to Admission medications   Medication Sig Start Date End Date Taking? Authorizing Provider  cephALEXin (KEFLEX) 500 MG capsule Take 1 capsule (500 mg total) by mouth 4 (four) times daily. 06/13/16   Marily Memos, MD  ibuprofen (ADVIL,MOTRIN) 800 MG tablet Take 0.5 tablets (400 mg total) by mouth every 8 (eight) hours as needed. 06/13/16   Marily Memos, MD  ondansetron (ZOFRAN) 4 MG tablet Take 1 tablet (4 mg total) by mouth every 8 (eight) hours as needed for nausea or vomiting. 06/13/16   Marily Memos, MD    Family History Family History  Problem Relation Age of Onset  . Hypertension Mother   . Heart disease Maternal Grandfather   . Colon cancer Neg Hx     Social History Social History  Substance Use Topics  . Smoking status: Current Every Day Smoker    Packs/day: 0.50    Types: Cigarettes  . Smokeless tobacco: Never Used  . Alcohol use Yes     Comment: "every now and then"   Allergies   Nitrofurantoin monohyd macro  Review of Systems Review of Systems  Constitutional: Negative for fever.  Gastrointestinal: Positive for abdominal pain, diarrhea and vomiting.  Genitourinary: Negative  for dysuria and hematuria.  All other systems reviewed and are negative.  Physical Exam Updated Vital Signs BP 103/65 (BP Location: Right Arm)   Pulse 94   Temp 98.9 F (37.2 C) (Oral)   Resp 18   Ht  (1.575 m)   Wt 105 lb (47.6 kg)   LMP 06/06/2016   SpO2 100%   BMI 19.20 kg/m   Physical Exam  Constitutional: She is oriented to person, place, and time. She appears well-developed and well-nourished. No distress.  HENT:  Head: Normocephalic and atraumatic.  Mucous membranes dry   Eyes: EOM are normal.  Neck:  Normal range of motion.  Cardiovascular: Normal rate, regular rhythm and normal heart sounds.   Pulmonary/Chest: Effort normal and breath sounds normal.  Abdominal: Soft. Bowel sounds are normal. She exhibits no distension. There is tenderness.  Mild periumbilical tenderness but no rebound or guarding. No CVA tenderness.   Musculoskeletal: Normal range of motion.  Neurological: She is alert and oriented to person, place, and time.  Skin: Skin is warm and dry.  Psychiatric: She has a normal mood and affect. Judgment normal.  Nursing note and vitals reviewed.  ED Treatments / Results  DIAGNOSTIC STUDIES:  Oxygen Saturation is 100% on RA, normal by my interpretation.    COORDINATION OF CARE:  1:08 PM Discussed treatment plan with pt at bedside and pt agreed to plan.  Labs (all labs ordered are listed, but only abnormal results are displayed) Labs Reviewed  CBC WITH DIFFERENTIAL/PLATELET - Abnormal; Notable for the following:       Result Value   Lymphs Abs 0.4 (*)    All other components within normal limits  COMPREHENSIVE METABOLIC PANEL - Abnormal; Notable for the following:    Sodium 134 (*)    Chloride 100 (*)    Glucose, Bld 103 (*)    All other components within normal limits  URINALYSIS, ROUTINE W REFLEX MICROSCOPIC - Abnormal; Notable for the following:    APPearance HAZY (*)    Ketones, ur 20 (*)    Protein, ur 30 (*)    Bacteria, UA RARE (*)    Squamous Epithelial / LPF 6-30 (*)    Non Squamous Epithelial 0-5 (*)    All other components within normal limits  URINE CULTURE  LIPASE, BLOOD  PREGNANCY, URINE    EKG  EKG Interpretation None       Radiology No results found.  Procedures Procedures (including critical care time)  Medications Ordered in ED Medications  ondansetron (ZOFRAN) injection 4 mg (4 mg Intravenous Given 06/13/16 1335)  sodium chloride 0.9 % bolus 1,000 mL (0 mLs Intravenous Stopped 06/13/16 1554)  sodium chloride 0.9 % bolus 1,000 mL  (0 mLs Intravenous Stopped 06/13/16 1606)  cephALEXin (KEFLEX) capsule 500 mg (500 mg Oral Given 06/13/16 1553)    Initial Impression / Assessment and Plan / ED Course  I have reviewed the triage vital signs and the nursing notes.  Pertinent labs & imaging results that were available during my care of the patient were reviewed by me and considered in my medical decision making (see chart for details).     Likely gastroenteritis with concurrent UTI. Abdomen remains benign. Tolerating PO after meds. Stable for dc.   I personally performed the services described in this documentation, which was scribed in my presence. The recorded information has been reviewed and is accurate.   Final Clinical Impressions(s) / ED Diagnoses   Final diagnoses:  Urinary tract infection without  hematuria, site unspecified    New Prescriptions New Prescriptions   CEPHALEXIN (KEFLEX) 500 MG CAPSULE    Take 1 capsule (500 mg total) by mouth 4 (four) times daily.   ONDANSETRON (ZOFRAN) 4 MG TABLET    Take 1 tablet (4 mg total) by mouth every 8 (eight) hours as needed for nausea or vomiting.     Marily Memos, MD 06/13/16 [phone]

## 2016-06-13 NOTE — ED Notes (Signed)
Asked pt for urine sample while pt is in bathroom

## 2016-06-15 LAB — URINE CULTURE: Culture: <10000 — AB

## 2016-09-21 ENCOUNTER — Emergency Department (HOSPITAL_COMMUNITY)
Admission: EM | Admit: 2016-09-21 | Discharge: 2016-09-21 | Disposition: A | Discharge status: Home / Self Care | Payer: Medicaid Other | Attending: Emergency Medicine | Admitting: Emergency Medicine

## 2016-09-21 ENCOUNTER — Encounter (HOSPITAL_COMMUNITY): Payer: Self-pay

## 2016-09-21 DIAGNOSIS — R102 Pelvic and perineal pain: Secondary | ICD-10-CM | POA: Diagnosis not present

## 2016-09-21 DIAGNOSIS — F17210 Nicotine dependence, cigarettes, uncomplicated: Secondary | ICD-10-CM | POA: Insufficient documentation

## 2016-09-21 DIAGNOSIS — M545 Low back pain: Secondary | ICD-10-CM | POA: Diagnosis present

## 2016-09-21 LAB — URINALYSIS, ROUTINE W REFLEX MICROSCOPIC
BILIRUBIN URINE: NEGATIVE
GLUCOSE, UA: NEGATIVE mg/dL
Hgb urine dipstick: NEGATIVE
KETONES UR: NEGATIVE mg/dL
NITRITE: NEGATIVE
Protein, ur: NEGATIVE mg/dL
Specific Gravity, Urine: 1.026 (ref 1.005–1.030)
pH: 6 (ref 5.0–8.0)

## 2016-09-21 LAB — WET PREP, GENITAL
Sperm: NONE SEEN
Trich, Wet Prep: NONE SEEN
Yeast Wet Prep HPF POC: NONE SEEN

## 2016-09-21 LAB — PREGNANCY, URINE: PREG TEST UR: NEGATIVE

## 2016-09-21 NOTE — Discharge Instructions (Signed)
Take ibuprofen or naproxen as needed for pain. Return if pain is getting worse.  We will contact you if any of your cultures or blood tests come back positive.

## 2016-09-21 NOTE — ED Provider Notes (Signed)
[facility]-EMERGENCY DEPT Provider Note   CSN: [id number] Arrival date & time: 09/21/16  0518     History   Chief Complaint Chief Complaint  Patient presents with  . S74.5    HPI [facility] is a 21 y.o. female.  The history is provided by the patient.  She comes in requesting screening for STDs. When asked why, she, at first, stated that she just like to be checked periodically. She then admitted to having a new sex partner. She also developed some mild suprapubic pain over the last 2 days. She denies any vaginal discharge. She denies fever or chills. She rates her pain at 6/10. Nothing makes it better, nothing makes it worse. She is not using any contraception. Last menses was July 16 and was normal.  Past Medical History:  Diagnosis Date  . Depression   . Nasal congestion 12/04/2012  . Tonsillar and adenoid hypertrophy 11/2012   snores during sleep, mother denies apnea    Patient Active Problem List   Diagnosis Date Noted  . Severe episode of recurrent major depressive disorder, without psychotic features (HCC)   . MDD (major depressive disorder), recurrent episode, severe (HCC) 12/05/2014  . AP (abdominal pain)   . Abdominal pain 03/25/2014  . Rectal bleeding 03/25/2014  . Insomnia 03/09/2014  . Loss of weight 03/09/2014  . Depression 03/09/2014  . Neuropathy 03/05/2012    Past Surgical History:  Procedure Laterality Date  . COLONOSCOPY N/A 04/13/2014   BX NL-ILEUM, COLON, RECTUM  . TONSILLECTOMY    . TONSILLECTOMY AND ADENOIDECTOMY Bilateral 12/09/2012   Procedure: BILATERAL TONSILLECTOMY AND ADENOIDECTOMY;  Surgeon: Darletta MollSui W Teoh, MD;  Location: [facility];  Service: ENT;  Laterality: Bilateral;  . WISDOM TOOTH EXTRACTION      OB History    Gravida Para Term Preterm AB Living   1 0 0 0 0 0   SAB TAB Ectopic Multiple Live Births   0 0 0 0         Home Medications    Prior to Admission medications   Medication Sig Start Date End Date  Taking? Authorizing Provider  ibuprofen (ADVIL,MOTRIN) 800 MG tablet Take 0.5 tablets (400 mg total) by mouth every 8 (eight) hours as needed. 06/13/16  Yes Mesner, Barbara CowerJason, MD  cephALEXin (KEFLEX) 500 MG capsule Take 1 capsule (500 mg total) by mouth 4 (four) times daily. 06/13/16   Mesner, Barbara CowerJason, MD  ondansetron (ZOFRAN) 4 MG tablet Take 1 tablet (4 mg total) by mouth every 8 (eight) hours as needed for nausea or vomiting. 06/13/16   Mesner, Barbara CowerJason, MD    Family History Family History  Problem Relation Age of Onset  . Hypertension Mother   . Heart disease Maternal Grandfather   . Colon cancer Neg Hx     Social History Social History  Substance Use Topics  . Smoking status: Current Every Day Smoker    Packs/day: 0.50    Types: Cigarettes  . Smokeless tobacco: Never Used  . Alcohol use Yes     Comment: "every now and then"     Allergies   Nitrofurantoin monohyd macro   Review of Systems Review of Systems  All other systems reviewed and are negative.    Physical Exam Updated Vital Signs BP 112/89 (BP Location: Left Arm)   Pulse 78   Temp 97.6 F (36.4 C) (Oral)   Resp 18   LMP 09/03/2016 (Approximate)   SpO2 100%   Physical Exam  Nursing note  and vitals reviewed.  21 year old female, resting comfortably and in no acute distress. Vital signs are normal. Oxygen saturation is 100%, which is normal. Head is normocephalic and atraumatic. PERRLA, EOMI. Oropharynx is clear. Neck is nontender and supple without adenopathy or JVD. Back is nontender and there is no CVA tenderness. Lungs are clear without rales, wheezes, or rhonchi. Chest is nontender. Heart has regular rate and rhythm without murmur. Abdomen is soft, flat, nontender without masses or hepatosplenomegaly and peristalsis is normoactive. Pelvic: Normal external female genitalia. Cervix is closed and appears normal. No significant vaginal discharge. No cervical motion tenderness. Fundus is normal size and position.  No adnexal masses, mild left adnexal tenderness. Extremities have no cyanosis or edema, full range of motion is present. Skin is warm and dry without rash. Neurologic: Mental status is normal, cranial nerves are intact, there are no motor or sensory deficits.  ED Treatments / Results  Labs (all labs ordered are listed, but only abnormal results are displayed) Labs Reviewed  WET PREP, GENITAL - Abnormal; Notable for the following:       Result Value   Clue Cells Wet Prep HPF POC PRESENT (*)    WBC, Wet Prep HPF POC MODERATE (*)    All other components within normal limits  URINALYSIS, ROUTINE W REFLEX MICROSCOPIC - Abnormal; Notable for the following:    APPearance HAZY (*)    Leukocytes, UA TRACE (*)    Bacteria, UA RARE (*)    Squamous Epithelial / LPF 0-5 (*)    All other components within normal limits  PREGNANCY, URINE  RPR  HIV ANTIBODY (ROUTINE TESTING)  GC/CHLAMYDIA PROBE AMP ([place]) NOT AT [facility]    Procedures Procedures (including critical care time)  Medications Ordered in ED Medications - No data to display   Initial Impression / Assessment and Plan / ED Course  I have reviewed the triage vital signs and the nursing notes.  Pertinent lab results that were available during my care of the patient were reviewed by me and considered in my medical decision making (see chart for details).  Mild pelvic pain with no findings on physical exam. Old records are reviewed, and she has 1 prior ED visit with her STD screening was done, diagnosis was bacterial vaginosis.  [place] is positive for clue cells, but she does not clinically have bacterial vaginosis, so no treatment initiated. RPR, HIV, chlamydia and gonorrhea results are pending. Patient is advised on safe sex practices. Advised we would inform her if any results are positive. 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 She is advised to use  over-the-counter NSAIDs for pain control.  Final Clinical Impressions(s) / ED Diagnoses   Final diagnoses:  Pelvic pain in female    New Prescriptions New Prescriptions   No medications on file     Dione BoozeGlick, Gerturde Kuba, MD 09/21/16 [phone]

## 2016-09-21 NOTE — ED Triage Notes (Signed)
Pt states she "gets checked for stds every few months"  And wants to get checked today.  Pt denies complaints, denies recent risky behaviors.  Pt states she usually goes to the health department for same.

## 2016-09-22 LAB — HIV ANTIBODY (ROUTINE TESTING W REFLEX): HIV SCREEN 4TH GENERATION: NONREACTIVE

## 2016-09-22 LAB — RPR: RPR: NONREACTIVE

## 2016-09-24 LAB — GC/CHLAMYDIA PROBE AMP (~~LOC~~) NOT AT ARMC
CHLAMYDIA, DNA PROBE: NEGATIVE
Neisseria Gonorrhea: NEGATIVE

## 2016-12-22 ENCOUNTER — Emergency Department (HOSPITAL_COMMUNITY)
Admission: EM | Admit: 2016-12-22 | Discharge: 2016-12-22 | Disposition: A | Discharge status: Home Health | Payer: Medicaid Other | Attending: Emergency Medicine | Admitting: Emergency Medicine

## 2016-12-22 ENCOUNTER — Encounter (HOSPITAL_COMMUNITY): Payer: Self-pay | Admitting: Emergency Medicine

## 2016-12-22 ENCOUNTER — Emergency Department (HOSPITAL_COMMUNITY): Payer: Medicaid Other

## 2016-12-22 DIAGNOSIS — F17210 Nicotine dependence, cigarettes, uncomplicated: Secondary | ICD-10-CM | POA: Diagnosis not present

## 2016-12-22 DIAGNOSIS — R1031 Right lower quadrant pain: Secondary | ICD-10-CM | POA: Insufficient documentation

## 2016-12-22 DIAGNOSIS — R110 Nausea: Secondary | ICD-10-CM | POA: Insufficient documentation

## 2016-12-22 LAB — URINALYSIS, ROUTINE W REFLEX MICROSCOPIC
Bacteria, UA: NONE SEEN
Bilirubin Urine: NEGATIVE
GLUCOSE, UA: NEGATIVE mg/dL
Ketones, ur: NEGATIVE mg/dL
NITRITE: NEGATIVE
Protein, ur: NEGATIVE mg/dL
SPECIFIC GRAVITY, URINE: 1.024 (ref 1.005–1.030)
pH: 5 (ref 5.0–8.0)

## 2016-12-22 LAB — CBC WITH DIFFERENTIAL/PLATELET
Basophils Absolute: 0 10*3/uL (ref 0.0–0.1)
Basophils Relative: 0 %
Eosinophils Absolute: 0.1 10*3/uL (ref 0.0–0.7)
Eosinophils Relative: 2 %
HEMATOCRIT: 40.5 % (ref 36.0–46.0)
Hemoglobin: 13.4 g/dL (ref 12.0–15.0)
LYMPHS PCT: 14 %
Lymphs Abs: 1.2 10*3/uL (ref 0.7–4.0)
MCH: 32.1 pg (ref 26.0–34.0)
MCHC: 33.1 g/dL (ref 30.0–36.0)
MCV: 96.9 fL (ref 78.0–100.0)
MONO ABS: 0.6 10*3/uL (ref 0.1–1.0)
MONOS PCT: 7 %
NEUTROS ABS: 6.7 10*3/uL (ref 1.7–7.7)
Neutrophils Relative %: 77 %
Platelets: 298 10*3/uL (ref 150–400)
RBC: 4.18 MIL/uL (ref 3.87–5.11)
RDW: 13 % (ref 11.5–15.5)
WBC: 8.6 10*3/uL (ref 4.0–10.5)

## 2016-12-22 LAB — LIPASE, BLOOD: LIPASE: 33 U/L (ref 11–51)

## 2016-12-22 LAB — COMPREHENSIVE METABOLIC PANEL
ALT: 12 U/L — ABNORMAL LOW (ref 14–54)
ANION GAP: 8 (ref 5–15)
AST: 16 U/L (ref 15–41)
Albumin: 4.5 g/dL (ref 3.5–5.0)
Alkaline Phosphatase: 45 U/L (ref 38–126)
BILIRUBIN TOTAL: 0.5 mg/dL (ref 0.3–1.2)
BUN: 12 mg/dL (ref 6–20)
CALCIUM: 9.1 mg/dL (ref 8.9–10.3)
CO2: 26 mmol/L (ref 22–32)
Chloride: 102 mmol/L (ref 101–111)
Creatinine, Ser: 0.67 mg/dL (ref 0.44–1.00)
GFR calc Af Amer: >60 mL/min (ref >60)
Glucose, Bld: 91 mg/dL (ref 65–99)
POTASSIUM: 4 mmol/L (ref 3.5–5.1)
Sodium: 136 mmol/L (ref 135–145)
TOTAL PROTEIN: 7.7 g/dL (ref 6.5–8.1)

## 2016-12-22 LAB — PREGNANCY, URINE: Preg Test, Ur: NEGATIVE

## 2016-12-22 MED ORDER — IOPAMIDOL (ISOVUE-300) INJECTION 61%
INTRAVENOUS | Status: AC
  Administered 2016-12-22: 30 mL via ORAL
  Filled 2016-12-22: qty 30

## 2016-12-22 MED ORDER — ONDANSETRON 4 MG PO TBDP: 4.0000 mg | ORAL_TABLET | Freq: Three times a day (TID) | ORAL | 0 refills | Status: DC | PRN

## 2016-12-22 MED ORDER — IBUPROFEN 600 MG PO TABS: 600.0000 mg | ORAL_TABLET | Freq: Four times a day (QID) | ORAL | 0 refills | Status: DC | PRN

## 2016-12-22 MED ORDER — IOPAMIDOL (ISOVUE-300) INJECTION 61%
100.0000 mL | Freq: Once | INTRAVENOUS | Status: AC | PRN
  Administered 2016-12-22: 100 mL via INTRAVENOUS

## 2016-12-22 NOTE — Discharge Instructions (Signed)
As discussed, your labs and imaging were normal today. No findings on CT to explain your abdominal pain. Appendix normal. Follow up with your primary care provider. Drink plenty of fluids to stay well-hydrated. Start reintroducing foods slowly starting with broths and bland foods.  Return if symptoms worsen or new concerning symptoms in the meantime.

## 2016-12-22 NOTE — ED Triage Notes (Signed)
Pt c/o abd pain with n/d today.

## 2016-12-22 NOTE — ED Provider Notes (Signed)
[facility] Provider Note   CSN: [id number] Arrival date & time: 12/22/16  4098     History   Chief Complaint Chief Complaint  Patient presents with  . Abdominal Pain    HPI Isabel ANGELL is a 21 y.o. female G0 P0 with no significant past medical history presenting with sudden onset sharp periumbilical pain radiating down to her right lower quadrant with associated nausea but no vomiting since this morning. She also reports an episode of nonbloody watery diarrhea. Denies fever, chills, vaginal discharge, pelvic pain, dysuria, hematuria, decreased urine. LMP 11/28/16, she is not currently on any birth control.  HPI  Past Medical History:  Diagnosis Date  . Depression   . Nasal congestion 12/04/2012  . Tonsillar and adenoid hypertrophy 11/2012   snores during sleep, mother denies apnea    Patient Active Problem List   Diagnosis Date Noted  . Severe episode of recurrent major depressive disorder, without psychotic features (HCC)   . MDD (major depressive disorder), recurrent episode, severe (HCC) 12/05/2014  . AP (abdominal pain)   . Abdominal pain 03/25/2014  . Rectal bleeding 03/25/2014  . Insomnia 03/09/2014  . Loss of weight 03/09/2014  . Depression 03/09/2014  . Neuropathy 03/05/2012    Past Surgical History:  Procedure Laterality Date  . COLONOSCOPY N/A 04/13/2014   BX NL-ILEUM, COLON, RECTUM  . TONSILLECTOMY    . TONSILLECTOMY AND ADENOIDECTOMY Bilateral 12/09/2012   Procedure: BILATERAL TONSILLECTOMY AND ADENOIDECTOMY;  Surgeon: Darletta Moll, MD;  Location: [facility];  Service: ENT;  Laterality: Bilateral;  . WISDOM TOOTH EXTRACTION      OB History    Gravida Para Term Preterm AB Living   1 0 0 0 0 0   SAB TAB Ectopic Multiple Live Births   0 0 0 0         Home Medications    Prior to Admission medications   Medication Sig Start Date End Date Taking? Authorizing Provider  ibuprofen (ADVIL,MOTRIN) 600 MG tablet  Take 1 tablet (600 mg total) by mouth every 6 (six) hours as needed. 12/22/16   Mathews Robinsons B, PA-C  ondansetron (ZOFRAN ODT) 4 MG disintegrating tablet Take 1 tablet (4 mg total) by mouth every 8 (eight) hours as needed for nausea or vomiting. 12/22/16   Isabel Shore, PA-C    Family History Family History  Problem Relation Age of Onset  . Hypertension Mother   . Heart disease Maternal Grandfather   . Colon cancer Neg Hx     Social History Social History  Substance Use Topics  . Smoking status: Current Every Day Smoker    Packs/day: 0.50    Types: Cigarettes  . Smokeless tobacco: Never Used  . Alcohol use Yes     Comment: "every now and then"     Allergies   Nitrofurantoin monohyd macro   Review of Systems Review of Systems  Constitutional: Negative for chills and fever.  Respiratory: Negative for cough, shortness of breath, wheezing and stridor.   Cardiovascular: Negative for chest pain and palpitations.  Gastrointestinal: Positive for abdominal pain, diarrhea and nausea. Negative for abdominal distention, blood in stool, constipation and vomiting.  Genitourinary: Positive for frequency. Negative for difficulty urinating, dysuria, flank pain, hematuria, pelvic pain, urgency, vaginal bleeding, vaginal discharge and vaginal pain.  Musculoskeletal: Negative for arthralgias, back pain and myalgias.  Skin: Negative for color change, pallor and rash.  Neurological: Negative for dizziness, seizures, syncope and light-headedness.  Physical Exam Updated Vital Signs BP 113/71 (BP Location: Right Arm)   Pulse 85   Temp 98.2 F (36.8 C) (Oral)   Resp 20   Ht 5\' 2"  (1.575 m)   Wt 47.6 kg (105 lb)   LMP 11/28/2016   SpO2 100%   Breastfeeding? Unknown   BMI 19.20 kg/m   Physical Exam  Constitutional: She appears well-developed and well-nourished. No distress.  Afebrile, nontoxic-appearing, sitting comfortably in bed in no acute distress.  HENT:  Head:  Normocephalic and atraumatic.  Eyes: Conjunctivae and EOM are normal. Right eye exhibits no discharge. Left eye exhibits no discharge. No scleral icterus.  Neck: Normal range of motion. Neck supple.  Cardiovascular: Normal rate, regular rhythm and normal heart sounds.   No murmur heard. Pulmonary/Chest: Effort normal and breath sounds normal. No respiratory distress. She has no wheezes. She has no rales.  Abdominal: Soft. Bowel sounds are normal. She exhibits no distension and no mass. There is tenderness. There is no rebound and no guarding.  Periumbilical tenderness palpation > right lower quadrant tenderness. Abdomen otherwise soft nontender, no mass or rebound tenderness  Musculoskeletal: Normal range of motion. She exhibits no edema.  Neurological: She is alert.  Skin: Skin is warm and dry. No rash noted. She is not diaphoretic. No erythema. No pallor.  Psychiatric: She has a normal mood and affect.  Nursing note and vitals reviewed.    ED Treatments / Results  Labs (all labs ordered are listed, but only abnormal results are displayed) Labs Reviewed  URINALYSIS, ROUTINE W REFLEX MICROSCOPIC - Abnormal; Notable for the following:       Result Value   APPearance HAZY (*)    Hgb urine dipstick SMALL (*)    Leukocytes, UA TRACE (*)    Squamous Epithelial / LPF 6-30 (*)    All other components within normal limits  COMPREHENSIVE METABOLIC PANEL - Abnormal; Notable for the following:    ALT 12 (*)    All other components within normal limits  URINE CULTURE  PREGNANCY, URINE  CBC WITH DIFFERENTIAL/PLATELET  LIPASE, BLOOD    EKG  EKG Interpretation None       Radiology Ct Abdomen Pelvis W Contrast  Result Date: 12/22/2016 CLINICAL DATA:  21 year old female with a history of abdominal pain EXAM: CT ABDOMEN AND PELVIS WITH CONTRAST TECHNIQUE: Multidetector CT imaging of the abdomen and pelvis was performed using the standard protocol following bolus administration of  intravenous contrast. CONTRAST:  ISOVUE-300 IOPAMIDOL (ISOVUE-300) INJECTION 61%, <See Chart> ISOVUE-300 IOPAMIDOL (ISOVUE-300) INJECTION 61% COMPARISON:  08/15/2013 FINDINGS: Lower chest: No acute abnormality. Hepatobiliary: No focal liver abnormality is seen. No gallstones, gallbladder wall thickening, or biliary dilatation. Pancreas: Unremarkable. No pancreatic ductal dilatation or surrounding inflammatory changes. Spleen: Normal in size without focal abnormality. Adrenals/Urinary Tract: Adrenal glands are unremarkable. Kidneys are normal, without renal calculi, focal lesion, or hydronephrosis. Bladder is unremarkable. Stomach/Bowel: Stomach is within normal limits. Normal appendix (Coronal view). No evidence of bowel wall thickening, distention, or inflammatory changes. Vascular/Lymphatic: No significant vascular findings are present. No enlarged abdominal or pelvic lymph nodes. Reproductive: Unremarkable appearance of the uterus with small amount of endometrial fluid. Rim enhancing structure of the right adnexa, likely follicle/physiologic changes. Other: None Musculoskeletal: No acute or significant osseous findings. IMPRESSION: No acute finding to account for abdominal pain. Physiologic changes of the adnexa. Electronically Signed   By: Gilmer Mor D.O.   On: 12/22/2016 12:43    Procedures Procedures (including critical care time)  Medications Ordered in ED Medications  iopamidol (ISOVUE-300) 61 % injection (30 mLs Oral Contrast Given 12/22/16 1215)  iopamidol (ISOVUE-300) 61 % injection 100 mL (100 mLs Intravenous Contrast Given 12/22/16 1214)     Initial Impression / Assessment and Plan / ED Course  I have reviewed the triage vital signs and the nursing notes.  Pertinent labs & imaging results that were available during my care of the patient were reviewed by me and considered in my medical decision making (see chart for details).    Patient presenting with sudden onset  periumbilical tenderness radiating around to the right lower quadrant with associated nausea and diarrhea.   Patient denies any nausea on my initial assessment and pain was manageable. Will reassess On reassessment, patient was comfortable and without nausea.  Tender to palpation in the periumbilical area exam. Patient is otherwise afebrile, nontoxic-appearing.  CT abdomen pelvis without acute findings to account for patient's symptoms. Normal appendix Labs unremarkable.  Discharge home with symptomatic relief and close follow up with PCP.  Discussed strict return precautions and advised to return to the emergency department if experiencing any new or worsening symptoms. Instructions were understood and patient agreed with discharge plan.  Final Clinical Impressions(s) / ED Diagnoses   Final diagnoses:  Nausea  Right lower quadrant abdominal pain    New Prescriptions New Prescriptions   IBUPROFEN (ADVIL,MOTRIN) 600 MG TABLET    Take 1 tablet (600 mg total) by mouth every 6 (six) hours as needed.   ONDANSETRON (ZOFRAN ODT) 4 MG DISINTEGRATING TABLET    Take 1 tablet (4 mg total) by mouth every 8 (eight) hours as needed for nausea or vomiting.     [facility]abel ShoreMitchell, Razan Siler B, PA-C 12/22/16 1424    Vanetta MuldersZackowski, Scott, MD 12/26/16 [phone]

## 2016-12-24 LAB — URINE CULTURE

## 2017-03-25 ENCOUNTER — Encounter (HOSPITAL_COMMUNITY): Payer: Self-pay | Admitting: Emergency Medicine

## 2017-03-25 ENCOUNTER — Emergency Department (HOSPITAL_COMMUNITY): Payer: Medicaid Other

## 2017-03-25 ENCOUNTER — Other Ambulatory Visit: Payer: Self-pay

## 2017-03-25 ENCOUNTER — Emergency Department (HOSPITAL_COMMUNITY)
Admission: EM | Admit: 2017-03-25 | Discharge: 2017-03-26 | Disposition: A | Discharge status: Home / Self Care | Payer: Medicaid Other | Attending: Emergency Medicine | Admitting: Emergency Medicine

## 2017-03-25 DIAGNOSIS — M546 Pain in thoracic spine: Secondary | ICD-10-CM

## 2017-03-25 DIAGNOSIS — F17210 Nicotine dependence, cigarettes, uncomplicated: Secondary | ICD-10-CM | POA: Insufficient documentation

## 2017-03-25 DIAGNOSIS — F329 Major depressive disorder, single episode, unspecified: Secondary | ICD-10-CM | POA: Insufficient documentation

## 2017-03-25 DIAGNOSIS — M545 Low back pain, unspecified: Secondary | ICD-10-CM

## 2017-03-25 LAB — HCG, SERUM, QUALITATIVE: PREG SERUM: NEGATIVE

## 2017-03-25 MED ORDER — IBUPROFEN 400 MG PO TABS
400.0000 mg | ORAL_TABLET | Freq: Once | ORAL | Status: AC
  Administered 2017-03-26: 400 mg via ORAL
  Filled 2017-03-25: qty 1

## 2017-03-25 MED ORDER — METHOCARBAMOL 500 MG PO TABS
500.0000 mg | ORAL_TABLET | Freq: Once | ORAL | Status: AC
  Administered 2017-03-26: 500 mg via ORAL
  Filled 2017-03-25: qty 1
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
## 2017-03-26 MED ORDER — NAPROXEN 250 MG PO TABS: ORAL_TABLET | ORAL | 0 refills | Status: DC

## 2017-03-26 MED ORDER — METHOCARBAMOL 500 MG PO TABS: ORAL_TABLET | ORAL | 0 refills | Status: DC

## 2017-03-26 NOTE — Discharge Instructions (Signed)
Ice packs to the injured or sore muscles for the next several days.  Take the medications for pain and muscle spasms. Return to the ED for any problems listed on the head injury sheet. Recheck if you aren't improving in the next week.
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
## 2017-04-25 ENCOUNTER — Encounter (HOSPITAL_COMMUNITY): Payer: Self-pay | Admitting: *Deleted

## 2017-04-25 ENCOUNTER — Emergency Department (HOSPITAL_COMMUNITY)
Admission: EM | Admit: 2017-04-25 | Discharge: 2017-04-26 | Disposition: A | Discharge status: Home / Self Care | Payer: Medicaid Other | Attending: Emergency Medicine | Admitting: Emergency Medicine

## 2017-04-25 ENCOUNTER — Other Ambulatory Visit: Payer: Self-pay

## 2017-04-25 DIAGNOSIS — Z79899 Other long term (current) drug therapy: Secondary | ICD-10-CM | POA: Insufficient documentation

## 2017-04-25 DIAGNOSIS — F17210 Nicotine dependence, cigarettes, uncomplicated: Secondary | ICD-10-CM | POA: Insufficient documentation

## 2017-04-25 DIAGNOSIS — A5901 Trichomonal vulvovaginitis: Secondary | ICD-10-CM | POA: Insufficient documentation

## 2017-04-25 LAB — URINALYSIS, ROUTINE W REFLEX MICROSCOPIC
Bacteria, UA: NONE SEEN
Bilirubin Urine: NEGATIVE
GLUCOSE, UA: NEGATIVE mg/dL
Hgb urine dipstick: NEGATIVE
KETONES UR: NEGATIVE mg/dL
Nitrite: NEGATIVE
PH: 7 (ref 5.0–8.0)
Protein, ur: NEGATIVE mg/dL
Specific Gravity, Urine: 1.023 (ref 1.005–1.030)

## 2017-04-25 LAB — PREGNANCY, URINE: PREG TEST UR: NEGATIVE

## 2017-04-25 NOTE — ED Provider Notes (Signed)
[facility] EMERGENCY DEPARTMENT Provider Note   CSN: [id number] Arrival date & time: 04/25/17  2119     History   Chief Complaint Chief Complaint  Patient presents with  . Urinary Tract Infection    HPI Isabel Blackwell is a 22 y.o. female.  HPI   Isabel Blackwell is a 22 y.o. female who presents to the Emergency Department complaining of burning with urination and increased frequency of urination for 1 day.  She states that she began taking over-the-counter Azo without relief.  She states that she has had a urinary tract infection in the past and her current symptoms feel similar.  She does admit to unprotected intercourse 1 week ago with a new sexual partner.  She denies any abdominal pain, fever, chills, vomiting, pelvic or back pain or vaginal discharge.  Endorses regular menses without use of birth control  Past Medical History:  Diagnosis Date  . Depression   . Nasal congestion 12/04/2012  . Tonsillar and adenoid hypertrophy 11/2012   snores during sleep, mother denies apnea    Patient Active Problem List   Diagnosis Date Noted  . Severe episode of recurrent major depressive disorder, without psychotic features (HCC)   . MDD (major depressive disorder), recurrent episode, severe (HCC) 12/05/2014  . AP (abdominal pain)   . Abdominal pain 03/25/2014  . Rectal bleeding 03/25/2014  . Insomnia 03/09/2014  . Loss of weight 03/09/2014  . Depression 03/09/2014  . Neuropathy 03/05/2012    Past Surgical History:  Procedure Laterality Date  . COLONOSCOPY N/A 04/13/2014   BX NL-ILEUM, COLON, RECTUM  . TONSILLECTOMY    . TONSILLECTOMY AND ADENOIDECTOMY Bilateral 12/09/2012   Procedure: BILATERAL TONSILLECTOMY AND ADENOIDECTOMY;  Surgeon: Darletta Moll, MD;  Location: [facility];  Service: ENT;  Laterality: Bilateral;  . WISDOM TOOTH EXTRACTION      OB History    Gravida Para Term Preterm AB Living   1 0 0 0 0 0   SAB TAB Ectopic Multiple Live Births   0 0  0 0         Home Medications    Prior to Admission medications   Medication Sig Start Date End Date Taking? Authorizing Provider  ibuprofen (ADVIL,MOTRIN) 600 MG tablet Take 1 tablet (600 mg total) by mouth every 6 (six) hours as needed. 12/22/16   Georgiana Shore, PA-C  methocarbamol (ROBAXIN) 500 MG tablet Take 1 or 2 po Q 6hrs for muscle pain 03/26/17   Devoria Albe, MD  naproxen (NAPROSYN) 250 MG tablet Take 1 po BID with food prn pain 03/26/17   Devoria Albe, MD  ondansetron (ZOFRAN ODT) 4 MG disintegrating tablet Take 1 tablet (4 mg total) by mouth every 8 (eight) hours as needed for nausea or vomiting. 12/22/16   Georgiana Shore, PA-C    Family History Family History  Problem Relation Age of Onset  . Hypertension Mother   . Heart disease Maternal Grandfather   . Colon cancer Neg Hx     Social History Social History   Tobacco Use  . Smoking status: Current Every Day Smoker    Packs/day: 0.50    Types: Cigarettes  . Smokeless tobacco: Never Used  Substance Use Topics  . Alcohol use: Yes    Comment: "every now and then"  . Drug use: Yes    Frequency: 1.0 times per week    Types: Marijuana    Comment: last used "a few days ago" as of 01/24/16  Allergies   Nitrofurantoin monohyd macro   Review of Systems Review of Systems  Constitutional: Negative for activity change, appetite change, chills and fever.  Respiratory: Negative for chest tightness and shortness of breath.   Cardiovascular: Negative for chest pain.  Gastrointestinal: Negative for abdominal pain, nausea and vomiting.  Genitourinary: Positive for dysuria and frequency. Negative for decreased urine volume, difficulty urinating, flank pain, hematuria, pelvic pain, vaginal bleeding and vaginal discharge.  Musculoskeletal: Negative for back pain.  Skin: Negative for rash.  Neurological: Negative for dizziness, weakness and numbness.  Hematological: Negative for adenopathy.  Psychiatric/Behavioral:  Negative for confusion.  All other systems reviewed and are negative.    Physical Exam Updated Vital Signs BP 125/89   Pulse 89   Temp 98.6 F (37 C) (Oral)   Resp 18   LMP 03/30/2017   SpO2 99%   Physical Exam  Constitutional: She is oriented to person, place, and time. She appears well-developed and well-nourished. No distress.  HENT:  Head: Normocephalic and atraumatic.  Cardiovascular: Normal rate, regular rhythm and intact distal pulses.  No murmur heard. Pulmonary/Chest: Effort normal and breath sounds normal. No respiratory distress. She has no wheezes. She has no rales.  Abdominal: Soft. Normal appearance. She exhibits no distension and no mass. There is no hepatosplenomegaly. There is no tenderness. There is no rigidity, no rebound, no guarding, no CVA tenderness and no tenderness at McBurney's point.  No CVA tenderness  Genitourinary: Uterus normal. Cervix exhibits no motion tenderness and no discharge. Right adnexum displays no mass and no tenderness. Left adnexum displays no mass and no tenderness. No tenderness or bleeding in the vagina.  Genitourinary Comments: Pelvic exam assisted by nursing. Creamy white vaginal d/c present.  No CMT.  No adnexal masses or tenderness.    Musculoskeletal: Normal range of motion. She exhibits no edema.  Neurological: She is alert and oriented to person, place, and time. Coordination normal.  Skin: Skin is warm and dry. No rash noted.  Nursing note and vitals reviewed.    ED Treatments / Results  Labs (all labs ordered are listed, but only abnormal results are displayed) Labs Reviewed  WET PREP, GENITAL - Abnormal; Notable for the following components:      Result Value   Trich, Wet Prep PRESENT (*)    Clue Cells Wet Prep HPF POC PRESENT (*)    WBC, Wet Prep HPF POC FEW (*)    All other components within normal limits  URINALYSIS, ROUTINE W REFLEX MICROSCOPIC - Abnormal; Notable for the following components:   Leukocytes, UA  SMALL (*)    Squamous Epithelial / LPF 0-5 (*)    All other components within normal limits  PREGNANCY, URINE  HIV ANTIBODY (ROUTINE TESTING)  RPR  GC/CHLAMYDIA PROBE AMP ([place]) NOT AT [facility]    EKG  EKG Interpretation None       Radiology No results found.  Procedures Procedures (including critical care time)  Medications Ordered in ED Medications - No data to display   Initial Impression / Assessment and Plan / ED Course  I have reviewed the triage vital signs and the nursing notes.  Pertinent labs & imaging results that were available during my care of the patient were reviewed by me and considered in my medical decision making (see chart for details).     Pt well appearing.  No abdominal pain on exam.  Doubt TOA or PID.  Admits to recent unprotected intercourse.  Wet prep positive for Trichomonas.  Patient is requesting treatment for possible gonorrhea and chlamydia as well.  Discussed results with patient and she agrees to treatment here with Rocephin, Zithromax, and Flagyl.  Patient also advised to inform all sexual partners.  Patient agrees to plan appears stable for discharge.  Final Clinical Impressions(s) / ED Diagnoses   Final diagnoses:  Trichomonas vaginitis    ED Discharge Orders    None       Pauline Ausriplett, Audrey Eller, PA-C 04/26/17 0105    Devoria AlbeKnapp, Iva, MD 04/26/17 [phone]

## 2017-04-25 NOTE — ED Triage Notes (Signed)
Pt c/o burning with urination and increase in frequency of urination that started a few days ago,

## 2017-04-26 LAB — WET PREP, GENITAL
Sperm: NONE SEEN
Yeast Wet Prep HPF POC: NONE SEEN

## 2017-04-26 MED ORDER — AZITHROMYCIN 250 MG PO TABS
1000.0000 mg | ORAL_TABLET | Freq: Once | ORAL | Status: AC
  Administered 2017-04-26: 1000 mg via ORAL
  Filled 2017-04-26: qty 4

## 2017-04-26 MED ORDER — CEFTRIAXONE SODIUM 250 MG IJ SOLR
250.0000 mg | Freq: Once | INTRAMUSCULAR | Status: AC
  Administered 2017-04-26: 250 mg via INTRAMUSCULAR
  Filled 2017-04-26: qty 250

## 2017-04-26 MED ORDER — LIDOCAINE HCL (PF) 1 % IJ SOLN
INTRAMUSCULAR | Status: AC
  Filled 2017-04-26: qty 2

## 2017-04-26 MED ORDER — METRONIDAZOLE 500 MG PO TABS
2000.0000 mg | ORAL_TABLET | Freq: Once | ORAL | Status: AC
  Administered 2017-04-26: 2000 mg via ORAL
  Filled 2017-04-26: qty 4

## 2017-04-26 NOTE — Discharge Instructions (Signed)
You will be contacted by someone from the hospital if any of your remaining test results are positive.  Avoid intercourse until your test results are back.  You may follow-up with the health department if needed.

## 2017-04-27 LAB — GC/CHLAMYDIA PROBE AMP (~~LOC~~) NOT AT ARMC
Chlamydia: NEGATIVE
Neisseria Gonorrhea: POSITIVE — AB

## 2017-04-27 LAB — HIV ANTIBODY (ROUTINE TESTING W REFLEX): HIV Screen 4th Generation wRfx: NONREACTIVE

## 2017-04-27 LAB — RPR: RPR: NONREACTIVE

## 2017-06-20 ENCOUNTER — Emergency Department (HOSPITAL_COMMUNITY)
Admission: EM | Admit: 2017-06-20 | Discharge: 2017-06-20 | Disposition: A | Discharge status: Home / Self Care | Payer: Medicaid Other | Attending: Emergency Medicine | Admitting: Emergency Medicine

## 2017-06-20 ENCOUNTER — Other Ambulatory Visit: Payer: Self-pay

## 2017-06-20 ENCOUNTER — Encounter (HOSPITAL_COMMUNITY): Payer: Self-pay | Admitting: Emergency Medicine

## 2017-06-20 DIAGNOSIS — F329 Major depressive disorder, single episode, unspecified: Secondary | ICD-10-CM | POA: Insufficient documentation

## 2017-06-20 DIAGNOSIS — F17210 Nicotine dependence, cigarettes, uncomplicated: Secondary | ICD-10-CM | POA: Insufficient documentation

## 2017-06-20 DIAGNOSIS — H1032 Unspecified acute conjunctivitis, left eye: Secondary | ICD-10-CM | POA: Insufficient documentation

## 2017-06-20 MED ORDER — CIPROFLOXACIN HCL 0.3 % OP SOLN
2.0000 [drp] | OPHTHALMIC | Status: DC
  Administered 2017-06-20: 2 [drp] via OPHTHALMIC
  Filled 2017-06-20: qty 2.5

## 2017-06-20 MED ORDER — CIPROFLOXACIN HCL 0.3 % OP SOLN
OPHTHALMIC | Status: AC
  Filled 2017-06-20: qty 2.5

## 2017-06-20 NOTE — ED Provider Notes (Signed)
[facility] EMERGENCY DEPARTMENT Provider Note   CSN: [id number] Arrival date & time: 06/20/17  0016  Time seen 02:05 AM   History   Chief Complaint Chief Complaint  Patient presents with  . Eye Pain    HPI Isabel Blackwell is a 22 y.o. female.  HPI patient states she started out with itching of her left eye about 2 weeks ago.  She states that then started having some swelling of the eyelid and some clear tearing in the past 3 or 4 days she has had some yellow drainage.  She states she has normal vision.  The itching is not as bad now but sometimes still is present.  She states sometimes it hurts when she closes her eyes.  She denies any fever.  She has been taking over-the-counter Clear Eyes in a pinkeye medication for the past week without relief.  She denies any exposure to children.  Patient wears glasses.  PCP Health, [facility]   Past Medical History:  Diagnosis Date  . Depression   . Nasal congestion 12/04/2012  . Tonsillar and adenoid hypertrophy 11/2012   snores during sleep, mother denies apnea    Patient Active Problem List   Diagnosis Date Noted  . Severe episode of recurrent major depressive disorder, without psychotic features (HCC)   . MDD (major depressive disorder), recurrent episode, severe (HCC) 12/05/2014  . AP (abdominal pain)   . Abdominal pain 03/25/2014  . Rectal bleeding 03/25/2014  . Insomnia 03/09/2014  . Loss of weight 03/09/2014  . Depression 03/09/2014  . Neuropathy 03/05/2012    Past Surgical History:  Procedure Laterality Date  . COLONOSCOPY N/A 04/13/2014   BX NL-ILEUM, COLON, RECTUM  . TONSILLECTOMY    . TONSILLECTOMY AND ADENOIDECTOMY Bilateral 12/09/2012   Procedure: BILATERAL TONSILLECTOMY AND ADENOIDECTOMY;  Surgeon: Darletta Moll, MD;  Location: [facility];  Service: ENT;  Laterality: Bilateral;  . WISDOM TOOTH EXTRACTION       OB History    Gravida  1   Para  0   Term  0   Preterm  0   AB    0   Living  0     SAB  0   TAB  0   Ectopic  0   Multiple  0   Live Births               Home Medications    Prior to Admission medications   Medication Sig Start Date End Date Taking? Authorizing Provider  ibuprofen (ADVIL,MOTRIN) 600 MG tablet Take 1 tablet (600 mg total) by mouth every 6 (six) hours as needed. 12/22/16   Georgiana Shore, PA-C  methocarbamol (ROBAXIN) 500 MG tablet Take 1 or 2 po Q 6hrs for muscle pain 03/26/17   Devoria Albe, MD  naproxen (NAPROSYN) 250 MG tablet Take 1 po BID with food prn pain 03/26/17   Devoria Albe, MD  ondansetron (ZOFRAN ODT) 4 MG disintegrating tablet Take 1 tablet (4 mg total) by mouth every 8 (eight) hours as needed for nausea or vomiting. 12/22/16   Georgiana Shore, PA-C    Family History Family History  Problem Relation Age of Onset  . Hypertension Mother   . Heart disease Maternal Grandfather   . Colon cancer Neg Hx     Social History Social History   Tobacco Use  . Smoking status: Current Every Day Smoker    Packs/day: 0.50    Types: Cigarettes  .  Smokeless tobacco: Never Used  Substance Use Topics  . Alcohol use: Yes    Comment: "every now and then"  . Drug use: Yes    Frequency: 1.0 times per week    Types: Marijuana    Comment: last used "a few days ago" as of 01/24/16     Allergies   Nitrofurantoin monohyd macro   Review of Systems Review of Systems  All other systems reviewed and are negative.    Physical Exam Updated Vital Signs BP 116/82   Pulse 87   Temp 99 F (37.2 C)   Resp 18   Ht  (1.6 m)   Wt 47.6 kg (105 lb)   LMP 05/25/2017   SpO2 99%   BMI 18.60 kg/m   Physical Exam  Constitutional: She is oriented to person, place, and time. She appears well-developed and well-nourished.  Playing on her cell phone when I enter the room and picks it up during her exam  HENT:  Head: Normocephalic and atraumatic.  Right Ear: External ear normal.  Left Ear: External ear normal.   Nose: Nose normal.  Mouth/Throat: Oropharynx is clear and moist.  Eyes: Pupils are equal, round, and reactive to light. Conjunctivae and EOM are normal.  Patient has mild diffuse injection of her left conjunctiva and sclera.  There is minimal swelling of the upper eyelid.  She nontender to palpation except over the lateral left upper eyelid.  There is no obvious drainage at this time.  She states that she has pain on range of motion only when looking up but no other direction.  Her visual acuity is 20/20 in each eye and together with glasses.  Cardiovascular: Normal rate.  Pulmonary/Chest: Effort normal. No respiratory distress.  Musculoskeletal: Normal range of motion.  Neurological: She is alert and oriented to person, place, and time. No cranial nerve deficit.  Skin: Skin is warm and dry. No erythema.  Psychiatric: She has a normal mood and affect. Her behavior is normal. Thought content normal.  Nursing note and vitals reviewed.    ED Treatments / Results  Labs (all labs ordered are listed, but only abnormal results are displayed) Labs Reviewed - No data to display  EKG None  Radiology No results found.  Procedures Procedures (including critical care time)  Medications Ordered in ED Medications  ciprofloxacin (CILOXAN) 0.3 % ophthalmic solution 2 drop (has no administration in time range)     Initial Impression / Assessment and Plan / ED Course  I have reviewed the triage vital signs and the nursing notes.  Pertinent labs & imaging results that were available during my care of the patient were reviewed by me and considered in my medical decision making (see chart for details).     Patient will be advised to stop the over-the-counter eyedrops she has been using, this may be making some of her symptoms worse.  She was started on Ciloxan eyedrops in the ED.  She appears to have conjunctivitis, at this point I do not suspect that she has periorbital cellulitis.  She also  does not appear to have a hordeolum or stye. She was advised to return for increasing swelling, fever, or worsening pain.  Final Clinical Impressions(s) / ED Diagnoses   Final diagnoses:  Acute conjunctivitis of left eye, unspecified acute conjunctivitis type    ED Discharge Orders    None      Plan discharge  Devoria Albe, MD, Concha Pyo, MD 06/20/17 [phone]

## 2017-06-20 NOTE — ED Triage Notes (Signed)
Pt c/o left eye pain, watering, swelling and drainage x 1 week.

## 2017-06-20 NOTE — Discharge Instructions (Addendum)
Use the ciloxin eye drops 2 drops in your right eye every 4 hrs while awake. STOP the OTC eye drops you have been using. You can take ibuprofen 400 mg 4 times a day for pain.  Recheck if you get worsening swelling, fever, chills, or have change in vision.

## 2018-06-24 ENCOUNTER — Other Ambulatory Visit: Payer: Self-pay

## 2018-06-24 ENCOUNTER — Emergency Department (HOSPITAL_COMMUNITY)
Admission: EM | Admit: 2018-06-24 | Discharge: 2018-06-24 | Disposition: A | Discharge status: Home / Self Care | Payer: Medicaid Other | Attending: Emergency Medicine | Admitting: Emergency Medicine

## 2018-06-24 ENCOUNTER — Encounter (HOSPITAL_COMMUNITY): Payer: Self-pay

## 2018-06-24 DIAGNOSIS — L309 Dermatitis, unspecified: Secondary | ICD-10-CM

## 2018-06-24 DIAGNOSIS — B9789 Other viral agents as the cause of diseases classified elsewhere: Secondary | ICD-10-CM | POA: Insufficient documentation

## 2018-06-24 DIAGNOSIS — B9689 Other specified bacterial agents as the cause of diseases classified elsewhere: Secondary | ICD-10-CM

## 2018-06-24 DIAGNOSIS — F17210 Nicotine dependence, cigarettes, uncomplicated: Secondary | ICD-10-CM | POA: Insufficient documentation

## 2018-06-24 DIAGNOSIS — N760 Acute vaginitis: Secondary | ICD-10-CM | POA: Insufficient documentation

## 2018-06-24 LAB — URINALYSIS, ROUTINE W REFLEX MICROSCOPIC
Bilirubin Urine: NEGATIVE
Glucose, UA: NEGATIVE mg/dL
Ketones, ur: NEGATIVE mg/dL
Leukocytes,Ua: NEGATIVE
Nitrite: NEGATIVE
Protein, ur: NEGATIVE mg/dL
Specific Gravity, Urine: 1.015 (ref 1.005–1.030)
pH: 6 (ref 5.0–8.0)

## 2018-06-24 LAB — WET PREP, GENITAL
Sperm: NONE SEEN
Trich, Wet Prep: NONE SEEN
WBC, Wet Prep HPF POC: NONE SEEN
Yeast Wet Prep HPF POC: NONE SEEN

## 2018-06-24 LAB — POC URINE PREG, ED: Preg Test, Ur: NEGATIVE

## 2018-06-24 MED ORDER — METRONIDAZOLE 500 MG PO TABS
500.0000 mg | ORAL_TABLET | Freq: Once | ORAL | Status: AC
  Administered 2018-06-24: 22:00:00 500 mg via ORAL
  Filled 2018-06-24: qty 1

## 2018-06-24 MED ORDER — METRONIDAZOLE 500 MG PO TABS: 500.0000 mg | ORAL_TABLET | Freq: Two times a day (BID) | ORAL | 0 refills | Status: DC

## 2018-06-24 MED ORDER — PIMECROLIMUS 1 % EX CREA: TOPICAL_CREAM | Freq: Two times a day (BID) | CUTANEOUS | 0 refills | Status: DC

## 2018-06-24 NOTE — ED Provider Notes (Signed)
[facility] EMERGENCY DEPARTMENT Provider Note   CSN: [id number] Arrival date & time: 06/24/18  1937    History   Chief Complaint Chief Complaint  Patient presents with  . Urine Frequency    HPI Isabel Blackwell is a 23 y.o. female with prior history of depression and uti presenting with a 1 day history of bladder pressure sensation along with increased urinary frequency and urgency.  She denies back pain, dysuria, hematuria, fevers, chills, n/v, abdominal pain and vaginal discharge.  Her symptoms are similar to prior uti.  She was last sexually active about 2 weeks ago, found her ex boyfriend had multiple partners before their split.  She endorses using condoms.  She also has complaint of facial breakout mostly along her lower cheeks and chin which has worsened since she has been wearing a face mask to work.  She has tried various acne medications (otc) without improvement.  She does not wear make up, chronically uses [employer] for washing.       The history is provided by the patient.    Past Medical History:  Diagnosis Date  . Depression   . Nasal congestion 12/04/2012  . Tonsillar and adenoid hypertrophy 11/2012   snores during sleep, mother denies apnea    Patient Active Problem List   Diagnosis Date Noted  . Severe episode of recurrent major depressive disorder, without psychotic features (HCC)   . MDD (major depressive disorder), recurrent episode, severe (HCC) 12/05/2014  . AP (abdominal pain)   . Abdominal pain 03/25/2014  . Rectal bleeding 03/25/2014  . Insomnia 03/09/2014  . Loss of weight 03/09/2014  . Depression 03/09/2014  . Neuropathy 03/05/2012    Past Surgical History:  Procedure Laterality Date  . COLONOSCOPY N/A 04/13/2014   BX NL-ILEUM, COLON, RECTUM  . TONSILLECTOMY    . TONSILLECTOMY AND ADENOIDECTOMY Bilateral 12/09/2012   Procedure: BILATERAL TONSILLECTOMY AND ADENOIDECTOMY;  Surgeon: Darletta Moll, MD;  Location: [facility];  Service:  ENT;  Laterality: Bilateral;  . WISDOM TOOTH EXTRACTION       OB History    Gravida  1   Para  0   Term  0   Preterm  0   AB  0   Living  0     SAB  0   TAB  0   Ectopic  0   Multiple  0   Live Births               Home Medications    Prior to Admission medications   Medication Sig Start Date End Date Taking? Authorizing Provider  metroNIDAZOLE (FLAGYL) 500 MG tablet Take 1 tablet (500 mg total) by mouth 2 (two) times daily. 06/24/18   Burgess Amor, PA-C  pimecrolimus (ELIDEL) 1 % cream Apply topically 2 (two) times daily. 06/24/18   Burgess Amor, PA-C    Family History Family History  Problem Relation Age of Onset  . Hypertension Mother   . Heart disease Maternal Grandfather   . Colon cancer Neg Hx     Social History Social History   Tobacco Use  . Smoking status: Current Every Day Smoker    Packs/day: 0.50    Types: Cigarettes  . Smokeless tobacco: Never Used  Substance Use Topics  . Alcohol use: Yes    Comment: "every now and then"  . Drug use: Not Currently    Frequency: 1.0 times per week    Types: Marijuana    Comment: last used "  a few days ago" as of 01/24/16     Allergies   Nitrofurantoin monohyd macro   Review of Systems Review of Systems  Constitutional: Negative for chills and fever.  HENT: Negative for congestion and sore throat.   Eyes: Negative.   Respiratory: Negative for chest tightness and shortness of breath.   Cardiovascular: Negative for chest pain.  Gastrointestinal: Negative for abdominal pain, nausea and vomiting.  Genitourinary: Positive for frequency and urgency. Negative for dysuria, flank pain, hematuria and vaginal discharge.  Musculoskeletal: Negative for back pain and neck pain.  Skin: Positive for rash. Negative for wound.  Neurological: Negative for dizziness, weakness, light-headedness, numbness and headaches.  Psychiatric/Behavioral: Negative.      Physical Exam Updated Vital Signs BP 126/87 (BP  Location: Right Arm)   Pulse 72   Temp 98.3 F (36.8 C) (Oral)   Resp 16   Ht 5\' 2"  (1.575 m)   Wt 47.2 kg   LMP 06/20/2018   SpO2 100%   BMI 19.02 kg/m   Physical Exam Vitals signs and nursing note reviewed. Exam conducted with a chaperone present.  Constitutional:      Appearance: She is well-developed.  HENT:     Head: Normocephalic and atraumatic.  Eyes:     Conjunctiva/sclera: Conjunctivae normal.  Neck:     Musculoskeletal: Normal range of motion.  Cardiovascular:     Rate and Rhythm: Normal rate and regular rhythm.     Heart sounds: Normal heart sounds.  Pulmonary:     Effort: Pulmonary effort is normal.     Breath sounds: Normal breath sounds. No wheezing.  Abdominal:     General: Bowel sounds are normal. There is no distension.     Palpations: Abdomen is soft.     Tenderness: There is no abdominal tenderness. There is no right CVA tenderness or left CVA tenderness.  Genitourinary:    Vagina: Normal. No vaginal discharge, erythema or bleeding.     Cervix: No discharge, friability or erythema.     Uterus: Normal. Not enlarged and not tender.      Adnexa: Right adnexa normal and left adnexa normal.       Right: No mass or tenderness.         Left: No mass or tenderness.       Comments: Trace blood at os. No dc. Musculoskeletal: Normal range of motion.  Skin:    General: Skin is warm and dry.     Findings: Rash present.     Comments: Mild rash noted bilateral cheeks and chin. No papules or folliculitis present.   Neurological:     Mental Status: She is alert.      ED Treatments / Results  Labs (all labs ordered are listed, but only abnormal results are displayed) Labs Reviewed  WET PREP, GENITAL - Abnormal; Notable for the following components:      Result Value   Clue Cells Wet Prep HPF POC PRESENT (*)    All other components within normal limits  URINALYSIS, ROUTINE W REFLEX MICROSCOPIC - Abnormal; Notable for the following components:   Hgb urine  dipstick LARGE (*)    Bacteria, UA RARE (*)    All other components within normal limits  HIV ANTIBODY (ROUTINE TESTING W REFLEX)  RPR  POC URINE PREG, ED  GC/CHLAMYDIA PROBE AMP ([place]) NOT AT [facility]    EKG None  Radiology No results found.  Procedures Procedures (including critical care time)  Medications Ordered in ED Medications  metroNIDAZOLE (FLAGYL) tablet 500 mg (has no administration in time range)     Initial Impression / Assessment and Plan / ED Course  I have reviewed the triage vital signs and the nursing notes.  Pertinent labs & imaging results that were available during my care of the patient were reviewed by me and considered in my medical decision making (see chart for details).        Urine negative except for rbc's - this was a clean catch, pt on menses.  Gc/chlamydia pending.  Pt also requested HIV and syphilis screening, also pending.  Wet prep positive for bv. Started on flagyl.   Facial rash favors contact dermatitis, possibly irritation by wearing mask.  Trial of Elidel which is safe for facial use.  Plan f/u with pcp prn.  Also given dermatology referral if needed for persistent sx.   Final Clinical Impressions(s) / ED Diagnoses   Final diagnoses:  Bacterial vaginosis  Dermatitis    ED Discharge Orders         Ordered    metroNIDAZOLE (FLAGYL) 500 MG tablet  2 times daily     06/24/18 2140    pimecrolimus (ELIDEL) 1 % cream  2 times daily     06/24/18 2140           Victoriano Laindol, Preesha Benjamin, PA-C 06/24/18 2147    Jacalyn LefevreHaviland, Jaydyn Menon, MD 06/24/18 2215

## 2018-06-24 NOTE — ED Triage Notes (Addendum)
Pt presents to ED with complaints of urine frequency which started today. Pt denies abdominal pain and fever. Pt states she is having pressure when she pees.

## 2018-06-24 NOTE — Discharge Instructions (Addendum)
Take the entire course of the antibiotic prescribed, with your next dose tomorrow morning.  Bacterial vaginosis can cause the symptom you are having - this is not a sexually transmitted disease.  Your gonorrhea, chlamydia, HIV and syphilis screenings are pending and should be resulted within the next 2 days.  You can check for the results in Mychart.  If any test is positive you will be notified by [place] and prescribed the appropriate treatment.   You have also been prescribed a cream to apply to the rash on your face.  Apply sparingly twice daily for up to 10 days.  I suspect your skin is responding from the face mask by getting inflamed - but does not look like a skin infection or acne.  You may want to consider seeing a dermatologist for further advice if your symptoms persist. See the referral above.

## 2018-06-26 LAB — HIV ANTIBODY (ROUTINE TESTING W REFLEX): HIV Screen 4th Generation wRfx: NONREACTIVE

## 2018-06-26 LAB — URINE CULTURE
Culture: <10000 — AB
Special Requests: NORMAL

## 2018-06-26 LAB — RPR: RPR Ser Ql: NONREACTIVE

## 2018-06-26 LAB — GC/CHLAMYDIA PROBE AMP (~~LOC~~) NOT AT ARMC
Chlamydia: NEGATIVE
Neisseria Gonorrhea: NEGATIVE

## 2018-09-25 ENCOUNTER — Other Ambulatory Visit: Payer: Self-pay

## 2018-09-25 ENCOUNTER — Emergency Department (HOSPITAL_COMMUNITY)
Admission: EM | Admit: 2018-09-25 | Discharge: 2018-09-25 | Disposition: A | Discharge status: Home / Self Care | Payer: Medicaid Other | Attending: Emergency Medicine | Admitting: Emergency Medicine

## 2018-09-25 DIAGNOSIS — Z79899 Other long term (current) drug therapy: Secondary | ICD-10-CM | POA: Insufficient documentation

## 2018-09-25 DIAGNOSIS — F419 Anxiety disorder, unspecified: Secondary | ICD-10-CM | POA: Insufficient documentation

## 2018-09-25 DIAGNOSIS — F17210 Nicotine dependence, cigarettes, uncomplicated: Secondary | ICD-10-CM | POA: Insufficient documentation

## 2018-09-25 LAB — CBG MONITORING, ED: Glucose-Capillary: 81 mg/dL (ref 70–99)

## 2018-09-25 MED ORDER — HYDROXYZINE HCL 25 MG PO TABS: ORAL_TABLET | ORAL | 0 refills | Status: DC

## 2018-09-25 NOTE — ED Triage Notes (Signed)
Patient brought in by EMS. Patient's mother stated to EMS that patient had a panic attack. Patient states that she has been under a lot of stress. Patient states that she is feeling depressed. Vital signs are stable. Patient has not been taking her prescribed medication for anxiety.

## 2018-09-25 NOTE — ED Notes (Signed)
Patient states she is feeling better. MD notified.

## 2018-09-25 NOTE — Discharge Instructions (Addendum)
Follow-up with her primary care doctor in 1 to 2 weeks for recheck

## 2018-09-25 NOTE — ED Provider Notes (Signed)
[facility] EMERGENCY DEPARTMENT Provider Note   CSN: [id number] Arrival date & time: 09/25/18  1954     History   Chief Complaint Chief Complaint  Patient presents with  . Panic Attack    HPI Isabel Blackwell is a 23 y.o. female.     Patient states that she was stressed out and anxious.  She has been having a lot of stressors.  Patient feels much better now.  She is not been suicidal or homicidal  The history is provided by the patient. No language interpreter was used.  Anxiety This is a new problem. The current episode started more than 1 week ago. The problem occurs rarely. The problem has been resolved. Pertinent negatives include no chest pain, no abdominal pain and no headaches. Nothing aggravates the symptoms. Nothing relieves the symptoms. She has tried nothing for the symptoms. The treatment provided no relief.    Past Medical History:  Diagnosis Date  . Depression   . Nasal congestion 12/04/2012  . Tonsillar and adenoid hypertrophy 11/2012   snores during sleep, mother denies apnea    Patient Active Problem List   Diagnosis Date Noted  . Severe episode of recurrent major depressive disorder, without psychotic features ([place])   . MDD (major depressive disorder), recurrent episode, severe ([place]) 12/05/2014  . AP (abdominal pain)   . Abdominal pain 03/25/2014  . Rectal bleeding 03/25/2014  . Insomnia 03/09/2014  . Loss of weight 03/09/2014  . Depression 03/09/2014  . Neuropathy 03/05/2012    Past Surgical History:  Procedure Laterality Date  . COLONOSCOPY N/A 04/13/2014   BX NL-ILEUM, COLON, RECTUM  . TONSILLECTOMY    . TONSILLECTOMY AND ADENOIDECTOMY Bilateral 12/09/2012   Procedure: BILATERAL TONSILLECTOMY AND ADENOIDECTOMY;  Surgeon: Ascencion Dike, MD;  Location: [place];  Service: ENT;  Laterality: Bilateral;  . WISDOM TOOTH EXTRACTION       OB History    Gravida  1   Para  0   Term  0   Preterm  0   AB  0   Living  0     SAB  0   TAB  0   Ectopic  0   Multiple  0   Live Births               Home Medications    Prior to Admission medications   Medication Sig Start Date End Date Taking? Authorizing Provider  pimecrolimus (ELIDEL) 1 % cream Apply topically 2 (two) times daily. 06/24/18  Yes Idol, Almyra Free, PA-C  hydrOXYzine (ATARAX/VISTARIL) 25 MG tablet Take 1 at bedtime to help with sleep.  Also you can take 1 every 6-8 hours if needed for anxiety 09/25/18   Milton Ferguson, MD    Family History Family History  Problem Relation Age of Onset  . Hypertension Mother   . Heart disease Maternal Grandfather   . Colon cancer Neg Hx     Social History Social History   Tobacco Use  . Smoking status: Current Every Day Smoker    Packs/day: 0.50    Types: Cigarettes  . Smokeless tobacco: Never Used  Substance Use Topics  . Alcohol use: Yes    Comment: "every now and then"  . Drug use: Not Currently    Frequency: 1.0 times per week    Types: Marijuana    Comment: last used "a few days ago" as of 01/24/16     Allergies   Nitrofurantoin monohyd macro   Review  of Systems Review of Systems  Constitutional: Negative for appetite change and fatigue.  HENT: Negative for congestion, ear discharge and sinus pressure.   Eyes: Negative for discharge.  Respiratory: Negative for cough.   Cardiovascular: Negative for chest pain.  Gastrointestinal: Negative for abdominal pain and diarrhea.  Genitourinary: Negative for frequency and hematuria.  Musculoskeletal: Negative for back pain.  Skin: Negative for rash.  Neurological: Negative for seizures and headaches.  Psychiatric/Behavioral: Positive for agitation. Negative for hallucinations.     Physical Exam Updated Vital Signs BP 120/89 (BP Location: Right Arm)   Pulse 60   Temp 98.1 F (36.7 C) (Oral)   Resp (!) 26   Ht $RemoveBeforeDEI _[id number]$5\' 2"ate)   SpO2 100%   BMI 19.20 kg/m   Physical Exam Vitals signs  and nursing note reviewed.  Constitutional:      Appearance: She is well-developed.  HENT:     Head: Normocephalic.     Nose: Nose normal.  Eyes:     General: No scleral icterus.    Conjunctiva/sclera: Conjunctivae normal.  Neck:     Musculoskeletal: Neck supple.     Thyroid: No thyromegaly.  Cardiovascular:     Rate and Rhythm: Normal rate and regular rhythm.     Heart sounds: No murmur. No friction rub. No gallop.   Pulmonary:     Breath sounds: No stridor. No wheezing or rales.  Chest:     Chest wall: No tenderness.  Abdominal:     General: There is no distension.     Tenderness: There is no abdominal tenderness. There is no rebound.  Musculoskeletal: Normal range of motion.  Lymphadenopathy:     Cervical: No cervical adenopathy.  Skin:    Findings: No erythema or rash.  Neurological:     Mental Status: She is oriented to person, place, and time.     Motor: No abnormal muscle tone.     Coordination: Coordination normal.     Comments: Patient is mildly anxious but not suicidal or homicidal  Psychiatric:        Behavior: Behavior normal.      ED Treatments / Results  Labs (all labs ordered are listed, but only abnormal results are displayed) Labs Reviewed  CBG MONITORING, ED    EKG None  Radiology No results found.  Procedures Procedures (including critical care time)  Medications Ordered in ED Medications - No data to display   Initial Impression / Assessment and Plan / ED Course  I have reviewed the triage vital signs and the nursing notes.  Pertinent labs & imaging results that were available during my care of the patient were reviewed by me and considered in my medical decision making (see chart for details).        Mild anxiety.  Patient is given Vistaril for anxiety and she also wants some sleep medicine and she was told she can take Vistaril also.  She will follow-up with the primary care doctor Final Clinical Impressions(s) / ED Diagnoses    Final diagnoses:  None    ED Discharge Orders         Ordered    hydrOXYzine (ATARAX/VISTARIL) 25 MG tablet     09/25/18 2201           Bethann BerkshireZammit, Sarah Baez, MD 09/25/18 2205

## 2019-02-09 ENCOUNTER — Other Ambulatory Visit: Payer: Medicaid Other

## 2019-02-11 ENCOUNTER — Ambulatory Visit: Payer: Medicaid Other | Attending: Internal Medicine

## 2019-02-11 ENCOUNTER — Other Ambulatory Visit: Payer: Self-pay

## 2019-02-11 DIAGNOSIS — Z20822 Contact with and (suspected) exposure to covid-19: Secondary | ICD-10-CM

## 2019-02-12 LAB — NOVEL CORONAVIRUS, NAA: SARS-CoV-2, NAA: NOT DETECTED

## 2019-04-20 ENCOUNTER — Other Ambulatory Visit: Payer: Self-pay

## 2019-04-20 ENCOUNTER — Encounter (HOSPITAL_COMMUNITY): Payer: Self-pay | Admitting: Emergency Medicine

## 2019-04-20 ENCOUNTER — Emergency Department (HOSPITAL_COMMUNITY)
Admission: EM | Admit: 2019-04-20 | Discharge: 2019-04-20 | Disposition: A | Discharge status: Home / Self Care | Payer: Medicaid Other | Attending: Emergency Medicine | Admitting: Emergency Medicine

## 2019-04-20 DIAGNOSIS — B359 Dermatophytosis, unspecified: Secondary | ICD-10-CM

## 2019-04-20 DIAGNOSIS — Z79899 Other long term (current) drug therapy: Secondary | ICD-10-CM | POA: Insufficient documentation

## 2019-04-20 DIAGNOSIS — B354 Tinea corporis: Secondary | ICD-10-CM | POA: Insufficient documentation

## 2019-04-20 DIAGNOSIS — F17210 Nicotine dependence, cigarettes, uncomplicated: Secondary | ICD-10-CM | POA: Insufficient documentation

## 2019-04-20 MED ORDER — FLUCONAZOLE 200 MG PO TABS: 200.0000 mg | ORAL_TABLET | Freq: Every day | ORAL | 0 refills | Status: DC

## 2019-04-20 MED ORDER — CLOTRIMAZOLE 1 % EX CREA: TOPICAL_CREAM | CUTANEOUS | 0 refills | Status: DC

## 2019-04-20 NOTE — ED Provider Notes (Signed)
[facility] EMERGENCY DEPARTMENT Provider Note   CSN: [id number] Arrival date & time: 04/20/19  0047     History Chief Complaint  Patient presents with  . Rash    Isabel Blackwell is a 24 y.o. female.  Patient presents to the emergency department for evaluation of rash.  Patient reports that she has noticed a painless rash under her right arm for about a month.  She has been washing the area and using creams but has not gotten better.  She now notices several similar spots on her right hip area.        Past Medical History:  Diagnosis Date  . Depression   . Nasal congestion 12/04/2012  . Tonsillar and adenoid hypertrophy 11/2012   snores during sleep, mother denies apnea    Patient Active Problem List   Diagnosis Date Noted  . Severe episode of recurrent major depressive disorder, without psychotic features (HCC)   . MDD (major depressive disorder), recurrent episode, severe (HCC) 12/05/2014  . AP (abdominal pain)   . Abdominal pain 03/25/2014  . Rectal bleeding 03/25/2014  . Insomnia 03/09/2014  . Loss of weight 03/09/2014  . Depression 03/09/2014  . Neuropathy 03/05/2012    Past Surgical History:  Procedure Laterality Date  . COLONOSCOPY N/A 04/13/2014   BX NL-ILEUM, COLON, RECTUM  . TONSILLECTOMY    . TONSILLECTOMY AND ADENOIDECTOMY Bilateral 12/09/2012   Procedure: BILATERAL TONSILLECTOMY AND ADENOIDECTOMY;  Surgeon: Darletta Moll, MD;  Location: [facility];  Service: ENT;  Laterality: Bilateral;  . WISDOM TOOTH EXTRACTION       OB History    Gravida  1   Para  0   Term  0   Preterm  0   AB  0   Living  0     SAB  0   TAB  0   Ectopic  0   Multiple  0   Live Births              Family History  Problem Relation Age of Onset  . Hypertension Mother   . Heart disease Maternal Grandfather   . Colon cancer Neg Hx     Social History   Tobacco Use  . Smoking status: Current Every Day Smoker    Packs/day: 0.50   Types: Cigarettes  . Smokeless tobacco: Never Used  Substance Use Topics  . Alcohol use: Yes    Comment: "every now and then"  . Drug use: Not Currently    Frequency: 1.0 times per week    Types: Marijuana    Comment: last used "a few days ago" as of 01/24/16    Home Medications Prior to Admission medications   Medication Sig Start Date End Date Taking? Authorizing Provider  clotrimazole (LOTRIMIN) 1 % cream Apply to affected area 2 times daily 04/20/19   Eri Platten, Canary Brim, MD  fluconazole (DIFLUCAN) 200 MG tablet Take 1 tablet (200 mg total) by mouth daily. 04/20/19   Gilda Crease, MD  hydrOXYzine (ATARAX/VISTARIL) 25 MG tablet Take 1 at bedtime to help with sleep.  Also you can take 1 every 6-8 hours if needed for anxiety 09/25/18   Bethann Berkshire, MD  pimecrolimus (ELIDEL) 1 % cream Apply topically 2 (two) times daily. 06/24/18   Burgess Amor, PA-C    Allergies    Nitrofurantoin monohyd macro  Review of Systems   Review of Systems  Constitutional: Negative for fever.  Skin: Positive for rash.  Physical Exam Updated Vital Signs BP 112/74   Pulse 88   Temp 98 F (36.7 C) (Oral)   Resp 17   LMP 03/26/2019   SpO2 99%   Physical Exam Vitals and nursing note reviewed. Exam conducted with a chaperone present.  HENT:     Head: Normocephalic.  Cardiovascular:     Rate and Rhythm: Normal rate and regular rhythm.  Pulmonary:     Effort: Pulmonary effort is normal.     Breath sounds: Normal breath sounds.  Musculoskeletal:     Cervical back: Normal range of motion.  Skin:    Comments: 2.5 x 0.5 cm lesion right axillary area -slightly hyperpigmented raised borders with central clearing  3 much smaller similar lesions right posterior hip  Neurological:     Mental Status: She is alert.     ED Results / Procedures / Treatments   Labs (all labs ordered are listed, but only abnormal results are displayed) Labs Reviewed - No data to  display  EKG None  Radiology No results found.  Procedures Procedures (including critical care time)  Medications Ordered in ED Medications - No data to display  ED Course  I have reviewed the triage vital signs and the nursing notes.  Pertinent labs & imaging results that were available during my care of the patient were reviewed by me and considered in my medical decision making (see chart for details).    MDM Rules/Calculators/A&P                      Morphology of rash is consistent with ringworm.  Final Clinical Impression(s) / ED Diagnoses Final diagnoses:  Ringworm    Rx / DC Orders ED Discharge Orders         Ordered    clotrimazole (LOTRIMIN) 1 % cream     04/20/19 0111    fluconazole (DIFLUCAN) 200 MG tablet  Daily     04/20/19 0111           Orpah Greek, MD 04/20/19 0111

## 2019-04-20 NOTE — ED Notes (Signed)
Pt ambulatory to waiting room. Pt verbalized understanding of discharge instructions.   

## 2019-04-20 NOTE — ED Triage Notes (Signed)
Pt C/O rash under her right axilla and right buttock. Pt reports rash under axilla has been there for 1 month.

## 2020-03-01 ENCOUNTER — Other Ambulatory Visit: Payer: Self-pay

## 2020-03-01 ENCOUNTER — Emergency Department (HOSPITAL_COMMUNITY)
Admission: EM | Admit: 2020-03-01 | Discharge: 2020-03-01 | Disposition: A | Discharge status: Home / Self Care | Payer: 59 | Attending: Emergency Medicine | Admitting: Emergency Medicine

## 2020-03-01 ENCOUNTER — Encounter (HOSPITAL_COMMUNITY): Payer: Self-pay | Admitting: Emergency Medicine

## 2020-03-01 DIAGNOSIS — U071 COVID-19: Secondary | ICD-10-CM | POA: Insufficient documentation

## 2020-03-01 DIAGNOSIS — G4489 Other headache syndrome: Secondary | ICD-10-CM

## 2020-03-01 DIAGNOSIS — R519 Headache, unspecified: Secondary | ICD-10-CM | POA: Diagnosis present

## 2020-03-01 DIAGNOSIS — F17210 Nicotine dependence, cigarettes, uncomplicated: Secondary | ICD-10-CM | POA: Diagnosis not present

## 2020-03-01 DIAGNOSIS — B349 Viral infection, unspecified: Secondary | ICD-10-CM

## 2020-03-01 LAB — POC SARS CORONAVIRUS 2 AG -  ED: SARS Coronavirus 2 Ag: NEGATIVE

## 2020-03-01 LAB — SARS CORONAVIRUS 2 (TAT 6-24 HRS): SARS Coronavirus 2: POSITIVE — AB

## 2020-03-01 NOTE — Discharge Instructions (Addendum)
You can end isolation after 5 days if you are fever free for 24 hours and your symptoms are improving.  Wear a mask at home and in public for additional 5 days.   If you continue to have fever, you should wait to end your isolation until you are fever free for 24 hours and you overall improving.  For members of your household or close contacts, they should quarantine if they have not been vaccinated or unable to be vaccinated.  They should wear a mask when around others.  They can be tested at the 5 day mark after last close contact with someone with COVID-19    

## 2020-03-01 NOTE — ED Provider Notes (Signed)
[facility] EMERGENCY DEPARTMENT Provider Note   CSN: [id number] Arrival date & time: 03/01/20  4818     History Chief Complaint  Patient presents with  . Covid Exposure    Isabel Blackwell is a 25 y.o. female.  The history is provided by the patient.  Headache Pain location:  Generalized Onset quality:  Gradual Timing:  Constant Progression:  Unchanged Chronicity:  New Relieved by:  Nothing Worsened by:  Nothing Associated symptoms: fatigue and fever   Associated symptoms: no cough, no sore throat and no vomiting   Patient reports over the past day she has had body aches, fatigue, fever and headache.  No cough.  She reports exposure to COVID-19.  She is unvaccinated.  No other acute complaint     Past Medical History:  Diagnosis Date  . Depression   . Nasal congestion 12/04/2012  . Tonsillar and adenoid hypertrophy 11/2012   snores during sleep, mother denies apnea    Patient Active Problem List   Diagnosis Date Noted  . Severe episode of recurrent major depressive disorder, without psychotic features (HCC)   . MDD (major depressive disorder), recurrent episode, severe (HCC) 12/05/2014  . AP (abdominal pain)   . Abdominal pain 03/25/2014  . Rectal bleeding 03/25/2014  . Insomnia 03/09/2014  . Loss of weight 03/09/2014  . Depression 03/09/2014  . Neuropathy 03/05/2012    Past Surgical History:  Procedure Laterality Date  . COLONOSCOPY N/A 04/13/2014   BX NL-ILEUM, COLON, RECTUM  . TONSILLECTOMY    . TONSILLECTOMY AND ADENOIDECTOMY Bilateral 12/09/2012   Procedure: BILATERAL TONSILLECTOMY AND ADENOIDECTOMY;  Surgeon: Darletta Moll, MD;  Location: [facility];  Service: ENT;  Laterality: Bilateral;  . WISDOM TOOTH EXTRACTION       OB History    Gravida  1   Para  0   Term  0   Preterm  0   AB  0   Living  0     SAB  0   IAB  0   Ectopic  0   Multiple  0   Live Births              Family History  Problem Relation Age  of Onset  . Hypertension Mother   . Heart disease Maternal Grandfather   . Colon cancer Neg Hx     Social History   Tobacco Use  . Smoking status: Current Every Day Smoker    Packs/day: 0.50    Types: Cigarettes  . Smokeless tobacco: Never Used  Vaping Use  . Vaping Use: Never used  Substance Use Topics  . Alcohol use: Yes    Comment: "every now and then"  . Drug use: Not Currently    Frequency: 1.0 times per week    Types: Marijuana    Comment: last used "a few days ago" as of 01/24/16    Home Medications Prior to Admission medications   Medication Sig Start Date End Date Taking? Authorizing Provider  clotrimazole (LOTRIMIN) 1 % cream Apply to affected area 2 times daily 04/20/19   Pollina, Canary Brim, MD  fluconazole (DIFLUCAN) 200 MG tablet Take 1 tablet (200 mg total) by mouth daily. 04/20/19   Gilda Crease, MD  hydrOXYzine (ATARAX/VISTARIL) 25 MG tablet Take 1 at bedtime to help with sleep.  Also you can take 1 every 6-8 hours if needed for anxiety 09/25/18   Bethann Berkshire, MD  pimecrolimus (ELIDEL) 1 % cream Apply topically 2 (two)  times daily. 06/24/18   Burgess Amor, PA-C    Allergies    Nitrofurantoin monohyd macro  Review of Systems   Review of Systems  Constitutional: Positive for fatigue and fever.  HENT: Negative for sore throat.   Respiratory: Positive for shortness of breath. Negative for cough.   Gastrointestinal: Negative for vomiting.  Neurological: Positive for headaches.    Physical Exam Updated Vital Signs BP 119/79 (BP Location: Left Arm)   Pulse 98   Temp 99.7 F (37.6 C) (Oral)   Resp 16   Ht 1.575 m (5\' 2" )   Wt 47.6 kg   SpO2 99%   BMI 19.20 kg/m   Physical Exam CONSTITUTIONAL: Well developed/well nourished HEAD: Normocephalic/atraumatic EYES: EOMI/PERRL ENMT: Mask in NECK: supple no meningeal signs SPINE/BACK:entire spine nontender CV: S1/S2 noted, no murmurs/rubs/gallops noted LUNGS: Lungs are clear to auscultation  bilaterally, no apparent distress ABDOMEN: soft, nontender NEURO: Pt is awake/alert/appropriate, moves all extremitiesx4.  No facial droop.   EXTREMITIES: pulses normal/equal, full ROM SKIN: warm, color normal PSYCH: no abnormalities of mood noted, alert and oriented to situation  ED Results / Procedures / Treatments   Labs (all labs ordered are listed, but only abnormal results are displayed) Labs Reviewed  SARS CORONAVIRUS 2 (TAT 6-24 HRS)    EKG None  Radiology No results found.  Procedures Procedures   Medications Ordered in ED Medications - No data to display  ED Course  I have reviewed the triage vital signs and the nursing notes.     MDM Rules/Calculators/A&P                          Strong suspicion this represents COVID-19. Patient is otherwise well-appearing. Will send COVID testing.  No neurodeficits.  No meningeal signs. Patient is appropriate for outpatient management  Isabel Blackwell was evaluated in Emergency Department on 03/01/2020 for the symptoms described in the history of present illness. She was evaluated in the context of the global COVID-19 pandemic, which necessitated consideration that the patient might be at risk for infection with the SARS-CoV-2 virus that causes COVID-19. Institutional protocols and algorithms that pertain to the evaluation of patients at risk for COVID-19 are in a state of rapid change based on information released by regulatory bodies including the CDC and federal and state organizations. These policies and algorithms were followed during the patient's care in the ED.  Final Clinical Impression(s) / ED Diagnoses Final diagnoses:  Viral illness  Other headache syndrome    Rx / DC Orders ED Discharge Orders    None       04/29/2020, MD 03/01/20 [phone]

## 2020-03-01 NOTE — ED Triage Notes (Signed)
Pt states she has been exposed to COVID by family. Pt began to have symptoms yesterday, fever, chills, and some shortness of breath.

## 2020-03-15 ENCOUNTER — Other Ambulatory Visit: Payer: 59

## 2021-01-24 DIAGNOSIS — Z113 Encounter for screening for infections with a predominantly sexual mode of transmission: Secondary | ICD-10-CM | POA: Diagnosis not present

## 2021-01-24 DIAGNOSIS — Z1388 Encounter for screening for disorder due to exposure to contaminants: Secondary | ICD-10-CM | POA: Diagnosis not present

## 2021-01-24 DIAGNOSIS — Z0389 Encounter for observation for other suspected diseases and conditions ruled out: Secondary | ICD-10-CM | POA: Diagnosis not present

## 2021-01-24 DIAGNOSIS — Z114 Encounter for screening for human immunodeficiency virus [HIV]: Secondary | ICD-10-CM | POA: Diagnosis not present

## 2021-01-24 DIAGNOSIS — Z720 Tobacco use: Secondary | ICD-10-CM | POA: Diagnosis not present

## 2021-01-24 DIAGNOSIS — Z3009 Encounter for other general counseling and advice on contraception: Secondary | ICD-10-CM | POA: Diagnosis not present

## 2021-01-24 DIAGNOSIS — Z01419 Encounter for gynecological examination (general) (routine) without abnormal findings: Secondary | ICD-10-CM | POA: Diagnosis not present

## 2021-01-24 DIAGNOSIS — Z1331 Encounter for screening for depression: Secondary | ICD-10-CM | POA: Diagnosis not present

## 2021-01-25 ENCOUNTER — Other Ambulatory Visit (HOSPITAL_COMMUNITY): Payer: Self-pay | Admitting: Obstetrics and Gynecology

## 2021-01-25 ENCOUNTER — Telehealth: Payer: Self-pay

## 2021-01-25 DIAGNOSIS — N6310 Unspecified lump in the right breast, unspecified quadrant: Secondary | ICD-10-CM

## 2021-01-25 NOTE — Telephone Encounter (Signed)
Attempted to call patient several times continuously in a row. Phone busy. I will call back at a later time.

## 2021-02-03 ENCOUNTER — Inpatient Hospital Stay (HOSPITAL_COMMUNITY): Payer: Medicaid Other | Attending: Hematology | Admitting: *Deleted

## 2021-02-03 ENCOUNTER — Other Ambulatory Visit: Payer: Self-pay

## 2021-02-03 NOTE — Progress Notes (Signed)
No exam completed. Patient left prior to being seen in [facility].

## 2021-02-07 ENCOUNTER — Ambulatory Visit (HOSPITAL_COMMUNITY): Admission: RE | Admit: 2021-02-07 | Payer: Self-pay | Source: Ambulatory Visit

## 2021-04-07 ENCOUNTER — Telehealth: Payer: Self-pay

## 2021-04-07 NOTE — Telephone Encounter (Signed)
Telephoned patient to reschedule [facility] appointment. Mobile number, not available at this time. Home number, mailbox full.

## 2021-04-24 ENCOUNTER — Telehealth: Payer: Self-pay

## 2021-04-24 NOTE — Telephone Encounter (Signed)
Telephoned patient at home number. Left message with mother. Patient will return call to [facility] for additional appointment information. ?

## 2021-05-03 LAB — CYTOLOGY - PAP

## 2021-05-19 ENCOUNTER — Encounter (HOSPITAL_COMMUNITY): Payer: Self-pay

## 2021-05-19 ENCOUNTER — Inpatient Hospital Stay (HOSPITAL_COMMUNITY): Payer: Medicaid Other | Attending: Obstetrics and Gynecology | Admitting: *Deleted

## 2021-05-19 VITALS — BP 113/81 | Wt 102.1 lb

## 2021-05-19 DIAGNOSIS — N6321 Unspecified lump in the left breast, upper outer quadrant: Secondary | ICD-10-CM

## 2021-05-19 DIAGNOSIS — N6315 Unspecified lump in the right breast, overlapping quadrants: Secondary | ICD-10-CM

## 2021-05-19 DIAGNOSIS — N6314 Unspecified lump in the right breast, lower inner quadrant: Secondary | ICD-10-CM

## 2021-05-19 DIAGNOSIS — Z1239 Encounter for other screening for malignant neoplasm of breast: Secondary | ICD-10-CM

## 2021-05-19 NOTE — Progress Notes (Addendum)
[name] is a 26 y.o. female who presents to [facility] clinic today with complaint of right breast lump x 4-5 months.  ?  ?Pap Smear: Pap smear not completed today. Last Pap smear was 06/01/2019 at the [facility] clinic and was normal. Per patient has no history of an abnormal Pap smear. Last Pap smear result is not available in Epic. Last Pap smear result will be scanned into Epic. ?  ?Physical exam: ?Breasts ?Breasts symmetrical. No skin abnormalities bilateral breasts. No nipple retraction bilateral breasts. No nipple discharge bilateral breasts. No lymphadenopathy. Palpated a pea sized mobile lump within the left breast at 1 o'clock 5 cm from the nipple. Palpated two pea sized mobile lumps within the right breast at 12 o'clock 2 cm from the areola and 4 o'clock 4 cm from the nipple. No complaints of pain or tenderness on exam.   ?  ?Pelvic/Bimanual ?Pap is not indicated today per [facility] guidelines. ?  ?Smoking History: ?Patient is a current smoker. Discussed smoking cessation with patient. Referred to the [facility] Quitline. ?  ?Patient Navigation: ?Patient education provided. Access to services provided for patient through [facility] program.  ?  ?Breast and Cervical Cancer Risk Assessment: ?Patient does not have family history of breast cancer, known genetic mutations, or radiation treatment to the chest before age 21. Patient does not have history of cervical dysplasia, immunocompromised, or DES exposure in-utero. Breast cancer risk assessment completed. No breast cancer risk calculated due to patient is less than 57 years old. ? ?Risk Assessment   ? ? Risk Scores   ? ?   05/19/2021  ? Last edited by: Narda Rutherford, LPN  ? 5-year risk:   ? Lifetime risk:   ? ?  ?  ? ?  ? ? ?A: ?[facility]CP exam without pap smear ?Complaint of right breast lump. ? ?P: ?Referred patient to [facility] for a bilateral breast ultrasound. Appointment scheduled Tuesday, May 23, 2021 at 1140. ? ?Priscille Heidelberg, RN ?05/19/2021 10:28 AM   ?

## 2021-05-19 NOTE — Patient Instructions (Addendum)
Explained breast self awareness with Isabel Blackwell. Patient did not need a Pap smear today due to last Pap smear was 06/01/2019. Let her know [facility] will cover Pap smears every 3 years unless has a history of abnormal Pap smears. Referred patient to [facility] for a bilateral breast ultrasound. Appointment scheduled Tuesday, May 23, 2021 at 1140. Patient aware of appointment and will be there. Discussed smoking cessation with patient. Referred to the [facility] Quitline. Isabel Blackwell verbalized understanding. ? ?Isabel Blackwell, Isabel Maser, RN ?10:37 AM ? ? ? ? ?

## 2021-05-22 ENCOUNTER — Other Ambulatory Visit (HOSPITAL_COMMUNITY): Payer: Self-pay | Admitting: Obstetrics and Gynecology

## 2021-05-22 DIAGNOSIS — N6320 Unspecified lump in the left breast, unspecified quadrant: Secondary | ICD-10-CM

## 2021-05-23 ENCOUNTER — Ambulatory Visit (HOSPITAL_COMMUNITY)
Admission: RE | Admit: 2021-05-23 | Discharge: 2021-05-23 | Disposition: A | Discharge status: Home / Self Care | Payer: Self-pay | Source: Ambulatory Visit | Attending: Obstetrics and Gynecology | Admitting: Obstetrics and Gynecology

## 2021-05-23 DIAGNOSIS — N6310 Unspecified lump in the right breast, unspecified quadrant: Secondary | ICD-10-CM

## 2021-05-23 DIAGNOSIS — N6320 Unspecified lump in the left breast, unspecified quadrant: Secondary | ICD-10-CM

## 2021-06-07 DIAGNOSIS — Z3009 Encounter for other general counseling and advice on contraception: Secondary | ICD-10-CM | POA: Diagnosis not present

## 2021-06-07 DIAGNOSIS — R42 Dizziness and giddiness: Secondary | ICD-10-CM | POA: Diagnosis not present

## 2021-06-07 DIAGNOSIS — Z113 Encounter for screening for infections with a predominantly sexual mode of transmission: Secondary | ICD-10-CM | POA: Diagnosis not present

## 2021-06-07 DIAGNOSIS — R5383 Other fatigue: Secondary | ICD-10-CM | POA: Diagnosis not present

## 2021-06-07 DIAGNOSIS — Z1388 Encounter for screening for disorder due to exposure to contaminants: Secondary | ICD-10-CM | POA: Diagnosis not present

## 2021-06-07 DIAGNOSIS — Z0389 Encounter for observation for other suspected diseases and conditions ruled out: Secondary | ICD-10-CM | POA: Diagnosis not present

## 2022-04-25 DIAGNOSIS — Z1388 Encounter for screening for disorder due to exposure to contaminants: Secondary | ICD-10-CM | POA: Diagnosis not present

## 2022-04-25 DIAGNOSIS — Z0389 Encounter for observation for other suspected diseases and conditions ruled out: Secondary | ICD-10-CM | POA: Diagnosis not present

## 2022-04-25 DIAGNOSIS — Z3009 Encounter for other general counseling and advice on contraception: Secondary | ICD-10-CM | POA: Diagnosis not present

## 2022-05-11 ENCOUNTER — Other Ambulatory Visit: Payer: Self-pay

## 2022-05-11 ENCOUNTER — Encounter (HOSPITAL_COMMUNITY): Payer: Self-pay

## 2022-05-11 ENCOUNTER — Emergency Department (HOSPITAL_COMMUNITY)
Admission: EM | Admit: 2022-05-11 | Discharge: 2022-05-12 | Disposition: A | Discharge status: Home / Self Care | Payer: Commercial Managed Care - HMO | Attending: Emergency Medicine | Admitting: Emergency Medicine

## 2022-05-11 DIAGNOSIS — R7989 Other specified abnormal findings of blood chemistry: Secondary | ICD-10-CM | POA: Diagnosis not present

## 2022-05-11 DIAGNOSIS — R079 Chest pain, unspecified: Secondary | ICD-10-CM | POA: Diagnosis present

## 2022-05-11 DIAGNOSIS — R0789 Other chest pain: Secondary | ICD-10-CM | POA: Insufficient documentation

## 2022-05-11 NOTE — ED Triage Notes (Signed)
Pt arrived via POV c/o sternal CP that radiates to her back. Pt reports she was laying down when pain began. Pt endorses weakness.

## 2022-05-12 ENCOUNTER — Emergency Department (HOSPITAL_COMMUNITY): Payer: Commercial Managed Care - HMO

## 2022-05-12 DIAGNOSIS — R0789 Other chest pain: Secondary | ICD-10-CM | POA: Diagnosis not present

## 2022-05-12 LAB — BASIC METABOLIC PANEL
Anion gap: 8 (ref 5–15)
BUN: 10 mg/dL (ref 6–20)
CO2: 21 mmol/L — ABNORMAL LOW (ref 22–32)
Calcium: 8.7 mg/dL — ABNORMAL LOW (ref 8.9–10.3)
Chloride: 103 mmol/L (ref 98–111)
Creatinine, Ser: 0.7 mg/dL (ref 0.44–1.00)
GFR, Estimated: >60 mL/min (ref >60)
Glucose, Bld: 96 mg/dL (ref 70–99)
Potassium: 3.3 mmol/L — ABNORMAL LOW (ref 3.5–5.1)
Sodium: 132 mmol/L — ABNORMAL LOW (ref 135–145)

## 2022-05-12 LAB — CBC
HCT: 38.6 % (ref 36.0–46.0)
Hemoglobin: 12.8 g/dL (ref 12.0–15.0)
MCH: 32.9 pg (ref 26.0–34.0)
MCHC: 33.2 g/dL (ref 30.0–36.0)
MCV: 99.2 fL (ref 80.0–100.0)
Platelets: 248 10*3/uL (ref 150–400)
RBC: 3.89 MIL/uL (ref 3.87–5.11)
RDW: 13.2 % (ref 11.5–15.5)
WBC: 4.8 10*3/uL (ref 4.0–10.5)
nRBC: 0 % (ref 0.0–0.2)

## 2022-05-12 LAB — D-DIMER, QUANTITATIVE: D-Dimer, Quant: 0.73 ug/mL-FEU — ABNORMAL HIGH (ref 0.00–0.50)

## 2022-05-12 LAB — TROPONIN I (HIGH SENSITIVITY): Troponin I (High Sensitivity): <2 ng/L (ref <18)

## 2022-05-12 LAB — I-STAT BETA HCG BLOOD, ED (MC, WL, AP ONLY): I-stat hCG, quantitative: <5 m[IU]/mL (ref <5)

## 2022-05-12 MED ORDER — ACETAMINOPHEN 500 MG PO TABS
1000.0000 mg | ORAL_TABLET | Freq: Once | ORAL | Status: AC
  Administered 2022-05-12: 1000 mg via ORAL
  Filled 2022-05-12: qty 2

## 2022-05-12 MED ORDER — IOHEXOL 350 MG/ML SOLN
75.0000 mL | Freq: Once | INTRAVENOUS | Status: AC | PRN
  Administered 2022-05-12: 75 mL via INTRAVENOUS

## 2022-05-12 MED ORDER — IBUPROFEN 800 MG PO TABS
800.0000 mg | ORAL_TABLET | Freq: Once | ORAL | Status: AC
  Administered 2022-05-12: 800 mg via ORAL
  Filled 2022-05-12: qty 1

## 2022-05-12 MED ORDER — IBUPROFEN 800 MG PO TABS: 800.0000 mg | ORAL_TABLET | Freq: Four times a day (QID) | ORAL | 0 refills | Status: DC | PRN

## 2022-05-12 NOTE — ED Provider Notes (Signed)
[facility] Provider Note   CSN: [id number] Arrival date & time: 05/11/22  2349     History  Chief Complaint  Patient presents with   Chest Pain    Isabel Blackwell is a 27 y.o. female.  Patient presents to the emergency department for evaluation of chest pain.  Patient reports that she had onset of sharp pain in the center of her chest around 5 PM.  Pain has been continuous since then.  She has increased pain with bending, twisting, moving.  She does feel the pain when she breathes but is not short of breath.       Home Medications Prior to Admission medications   Medication Sig Start Date End Date Taking? Authorizing Provider  ibuprofen (ADVIL) 800 MG tablet Take 1 tablet (800 mg total) by mouth every 6 (six) hours as needed for moderate pain. 05/12/22  Yes Chaz Ronning, Gwenyth Allegra, MD  clotrimazole (LOTRIMIN) 1 % cream Apply to affected area 2 times daily 04/20/19   Orpah Greek, MD  fluconazole (DIFLUCAN) 200 MG tablet Take 1 tablet (200 mg total) by mouth daily. 04/20/19   Orpah Greek, MD  hydrOXYzine (ATARAX/VISTARIL) 25 MG tablet Take 1 at bedtime to help with sleep.  Also you can take 1 every 6-8 hours if needed for anxiety 09/25/18   Milton Ferguson, MD  pimecrolimus (ELIDEL) 1 % cream Apply topically 2 (two) times daily. 06/24/18   Evalee Jefferson, PA-C      Allergies    Nitrofurantoin monohyd macro    Review of Systems   Review of Systems  Physical Exam Updated Vital Signs BP 128/83   Pulse 100   Temp 99.9 F (37.7 C) (Oral)   Resp 14   Ht 5\' 2"  (1.575 m)   Wt 47.6 kg   LMP 05/05/2022 (Exact Date) Comment: negative preg test  SpO2 100%   BMI 19.20 kg/m  Physical Exam Vitals and nursing note reviewed.  Constitutional:      General: She is not in acute distress.    Appearance: She is well-developed.  HENT:     Head: Normocephalic and atraumatic.     Mouth/Throat:     Mouth: Mucous membranes are  moist.  Eyes:     General: Vision grossly intact. Gaze aligned appropriately.     Extraocular Movements: Extraocular movements intact.     Conjunctiva/sclera: Conjunctivae normal.  Cardiovascular:     Rate and Rhythm: Normal rate and regular rhythm.     Pulses: Normal pulses.     Heart sounds: Normal heart sounds, S1 normal and S2 normal. No murmur heard.    No friction rub. No gallop.  Pulmonary:     Effort: Pulmonary effort is normal. No respiratory distress.     Breath sounds: Normal breath sounds.  Chest:     Chest wall: Tenderness present.    Abdominal:     General: Bowel sounds are normal.     Palpations: Abdomen is soft.     Tenderness: There is no abdominal tenderness. There is no guarding or rebound.     Hernia: No hernia is present.  Musculoskeletal:        General: No swelling.     Cervical back: Full passive range of motion without pain, normal range of motion and neck supple. No spinous process tenderness or muscular tenderness. Normal range of motion.     Right lower leg: No edema.     Left lower leg:  No edema.  Skin:    General: Skin is warm and dry.     Capillary Refill: Capillary refill takes less than 2 seconds.     Findings: No ecchymosis, erythema, rash or wound.  Neurological:     General: No focal deficit present.     Mental Status: She is alert and oriented to person, place, and time.     GCS: GCS eye subscore is 4. GCS verbal subscore is 5. GCS motor subscore is 6.     Cranial Nerves: Cranial nerves 2-12 are intact.     Sensory: Sensation is intact.     Motor: Motor function is intact.     Coordination: Coordination is intact.  Psychiatric:        Attention and Perception: Attention normal.        Mood and Affect: Mood normal.        Speech: Speech normal.        Behavior: Behavior normal.     ED Results / Procedures / Treatments   Labs (all labs ordered are listed, but only abnormal results are displayed) Labs Reviewed  BASIC METABOLIC PANEL  - Abnormal; Notable for the following components:      Result Value   Sodium 132 (*)    Potassium 3.3 (*)    CO2 21 (*)    Calcium 8.7 (*)    All other components within normal limits  D-DIMER, QUANTITATIVE - Abnormal; Notable for the following components:   D-Dimer, Quant 0.73 (*)    All other components within normal limits  CBC  I-STAT BETA HCG BLOOD, ED ([facility], [facility], [facility] ONLY)  TROPONIN I (HIGH SENSITIVITY)    EKG EKG Interpretation  Date/Time:  Friday May 11 2022 23:59:58 EDT Ventricular Rate:  94 PR Interval:  163 QRS Duration: 70 QT Interval:  322 QTC Calculation: 403 R Axis:   58 Text Interpretation: Sinus rhythm Borderline T abnormalities, inferior leads No significant change since last tracing Confirmed by Orpah Greek [phone]) on 05/12/2022 12:02:24 AM  Radiology CT Angio Chest Pulmonary Embolism (PE) W or WO Contrast  Result Date: 05/12/2022 CLINICAL DATA:  Pulmonary embolism (PE) suspected, low to intermediate prob, positive D-dimer EXAM: CT ANGIOGRAPHY CHEST WITH CONTRAST TECHNIQUE: Multidetector CT imaging of the chest was performed using the standard protocol during bolus administration of intravenous contrast. Multiplanar CT image reconstructions and MIPs were obtained to evaluate the vascular anatomy. RADIATION DOSE REDUCTION: This exam was performed according to the departmental dose-optimization program which includes automated exposure control, adjustment of the mA and/or kV according to patient size and/or use of iterative reconstruction technique. CONTRAST:  7mL OMNIPAQUE IOHEXOL 350 MG/ML SOLN COMPARISON:  None Available. FINDINGS: Cardiovascular: Adequate opacification of the pulmonary arterial tree. No intraluminal filling defect identified to suggest acute pulmonary embolism. Central pulmonary arteries are of normal caliber. No significant coronary artery calcification. Cardiac size within normal limits. No pericardial effusion. No significant  atherosclerotic calcification within the thoracic aorta. No aortic aneurysm. Mediastinum/Nodes: No enlarged mediastinal, hilar, or axillary lymph nodes. Thyroid gland, trachea, and esophagus demonstrate no significant findings. Lungs/Pleura: Lungs are clear. No pleural effusion or pneumothorax. Upper Abdomen: No acute abnormality. Musculoskeletal: No chest wall abnormality. No acute or significant osseous findings. Review of the MIP images confirms the above findings. IMPRESSION: 1. No pulmonary embolism. No acute intrathoracic pathology identified. Electronically Signed   By: Fidela Salisbury M.D.   On: 05/12/2022 01:52   DG Chest 2 View  Result Date: 05/12/2022 CLINICAL DATA:  chest pain EXAM: CHEST - 2 VIEW COMPARISON:  Chest x-ray 07/20/2008 FINDINGS: The heart and mediastinal contours are within normal limits. No focal consolidation. No pulmonary edema. No pleural effusion. No pneumothorax. No acute osseous abnormality. IMPRESSION: No active cardiopulmonary disease. Electronically Signed   By: Iven Finn M.D.   On: 05/12/2022 00:35    Procedures Procedures    Medications Ordered in ED Medications  ibuprofen (ADVIL) tablet 800 mg (800 mg Oral Given 05/12/22 0103)  acetaminophen (TYLENOL) tablet 1,000 mg (1,000 mg Oral Given 05/12/22 0103)  iohexol (OMNIPAQUE) 350 MG/ML injection 75 mL (75 mLs Intravenous Contrast Given 05/12/22 0136)    ED Course/ Medical Decision Making/ A&P                             Medical Decision Making Amount and/or Complexity of Data Reviewed Labs: ordered. Decision-making details documented in ED Course. Radiology: ordered and independent interpretation performed. Decision-making details documented in ED Course. ECG/medicine tests: ordered and independent interpretation performed. Decision-making details documented in ED Course.  Risk OTC drugs. Prescription drug management.   Differential Diagnosis considered includes, but not limited to: STEMI;  NSTEMI; myocarditis; pericarditis; pulmonary embolism; aortic dissection; pneumothorax; pneumonia; gastritis.  Presents to the emerged part for evaluation of chest pain.  Chest pain has been ongoing for more than 6 hours.  Pain has been continuous.  Examination is very consistent with chest wall pain.  She has reproducible pain with palpation and movements.  No independent cardiac risk factors.  EKG without obvious ischemia, no infarct.  Troponin negative.  Patient felt to be low likelihood for PE, however she was tachycardic upon initial vital signs, could not PERC.  Patient had a D-dimer drawn.  It was mildly elevated.  Patient underwent CT angiography.  No evidence of PE, no other pathology.  Discharge, treat for chest wall pain.         Final Clinical Impression(s) / ED Diagnoses Final diagnoses:  Chest wall pain    Rx / DC Orders ED Discharge Orders          Ordered    ibuprofen (ADVIL) 800 MG tablet  Every 6 hours PRN        05/12/22 0204              Orpah Greek, MD 05/12/22 [phone]

## 2022-06-08 ENCOUNTER — Ambulatory Visit: Payer: Commercial Managed Care - HMO | Admitting: Family Medicine

## 2022-06-08 ENCOUNTER — Encounter: Payer: Self-pay | Admitting: Family Medicine

## 2022-06-08 VITALS — BP 115/81 | HR 91 | Ht 62.0 in | Wt 102.0 lb

## 2022-06-08 DIAGNOSIS — E7849 Other hyperlipidemia: Secondary | ICD-10-CM | POA: Diagnosis not present

## 2022-06-08 DIAGNOSIS — Z0001 Encounter for general adult medical examination with abnormal findings: Secondary | ICD-10-CM

## 2022-06-08 DIAGNOSIS — E0789 Other specified disorders of thyroid: Secondary | ICD-10-CM

## 2022-06-08 DIAGNOSIS — E559 Vitamin D deficiency, unspecified: Secondary | ICD-10-CM | POA: Diagnosis not present

## 2022-06-08 DIAGNOSIS — R42 Dizziness and giddiness: Secondary | ICD-10-CM

## 2022-06-08 DIAGNOSIS — R7301 Impaired fasting glucose: Secondary | ICD-10-CM | POA: Diagnosis not present

## 2022-06-08 DIAGNOSIS — N941 Unspecified dyspareunia: Secondary | ICD-10-CM

## 2022-06-08 NOTE — Progress Notes (Signed)
Complete physical exam  Patient: Isabel Blackwell   DOB: 1995-06-21   26 y.o. Female  MRN: [id number]  Subjective:    Chief Complaint  Patient presents with   Establish Care    New patient, would like to be evaluated for dm due to dizzy spells, also having vaginal concerns having pain with intercourse.     Isabel Blackwell is a 27 y.o. female who presents today for a complete physical exam. She reports consuming a general diet. The patient does not participate in regular exercise at present. She generally feels well. She reports sleeping fairly well. She does have additional problems to discuss today.    Most recent fall risk assessment:    06/08/2022    9:17 AM  Fall Risk   Falls in the past year? 0  Number falls in past yr: 0  Injury with Fall? 0  Risk for fall due to : No Fall Risks  Follow up Falls evaluation completed     Most recent depression screenings:    06/08/2022    9:17 AM 02/02/2016    3:42 PM  PHQ 2/9 Scores  PHQ - 2 Score 0 0  PHQ- 9 Score 3 1    Vision:Within last year and Dental: No current dental problems and Receives regular dental care  Patient Care Team: Del Newman Nip, Tenna Child, FNP as PCP - General (Family Medicine) [place], MD (Inactive) as Consulting Physician (Gastroenterology) [place]ann[place], Carlye Grippe, RN   Outpatient Medications Prior to Visit  Medication Sig   [DISCONTINUED] clotrimazole (LOTRIMIN) 1 % cream Apply to affected area 2 times daily   [DISCONTINUED] fluconazole (DIFLUCAN) 200 MG tablet Take 1 tablet (200 mg total) by mouth daily.   [DISCONTINUED] hydrOXYzine (ATARAX/VISTARIL) 25 MG tablet Take 1 at bedtime to help with sleep.  Also you can take 1 every 6-8 hours if needed for anxiety (Patient not taking: Reported on 06/08/2022)   [DISCONTINUED] ibuprofen (ADVIL) 800 MG tablet Take 1 tablet (800 mg total) by mouth every 6 (six) hours as needed for moderate pain. (Patient not taking: Reported on 06/08/2022)   [DISCONTINUED]  pimecrolimus (ELIDEL) 1 % cream Apply topically 2 (two) times daily.   No facility-administered medications prior to visit.    Review of Systems  Constitutional:  Negative for chills and fever.  HENT:  Negative for hearing loss.   Eyes:  Negative for blurred vision and double vision.  Respiratory:  Negative for cough.   Cardiovascular:  Negative for chest pain.  Gastrointestinal:  Negative for heartburn and nausea.  Genitourinary:  Negative for dysuria, flank pain, frequency and urgency.  Musculoskeletal:  Negative for myalgias.  Skin:  Negative for rash.  Neurological:  Positive for dizziness and headaches.  Psychiatric/Behavioral:  Negative for depression.        Objective:    BP 115/81   Pulse 91   Ht 5\' 2"  (1.575 m)   Wt 102 lb 0.2 oz (46.3 kg)   LMP 05/05/2022 (Exact Date) Comment: negative preg test  SpO2 97%   BMI 18.66 kg/m  BP Readings from Last 3 Encounters:  06/08/22 115/81  05/12/22 111/78  05/19/21 113/81      Physical Exam Vitals reviewed.  Constitutional:      General: She is not in acute distress.    Appearance: Normal appearance. She is not ill-appearing, toxic-appearing or diaphoretic.  HENT:     Head: Normocephalic.     Right Ear: Tympanic membrane normal.  Left Ear: Tympanic membrane normal.     Nose: Nose normal. No congestion.     Mouth/Throat:     Mouth: Mucous membranes are moist.  Eyes:     General:        Right eye: No discharge.        Left eye: No discharge.     Extraocular Movements: Extraocular movements intact.     Conjunctiva/sclera: Conjunctivae normal.     Pupils: Pupils are equal, round, and reactive to light.  Cardiovascular:     Rate and Rhythm: Normal rate.     Pulses: Normal pulses.     Heart sounds: Normal heart sounds.  Pulmonary:     Effort: Pulmonary effort is normal. No respiratory distress.     Breath sounds: Normal breath sounds.  Abdominal:     General: Bowel sounds are normal.     Palpations: Abdomen  is soft.     Tenderness: There is no abdominal tenderness. There is no right CVA tenderness, left CVA tenderness or guarding.  Musculoskeletal:        General: Normal range of motion.     Cervical back: Normal range of motion.  Skin:    General: Skin is warm and dry.     Capillary Refill: Capillary refill takes less than 2 seconds.  Neurological:     General: No focal deficit present.     Mental Status: She is alert and oriented to person, place, and time.     Coordination: Coordination normal.     Gait: Gait normal.  Psychiatric:        Mood and Affect: Mood normal.        Behavior: Behavior normal.        Thought Content: Thought content normal.        Judgment: Judgment normal.      No results found for any visits on 06/08/22.    Assessment & Plan:    Routine Health Maintenance and Physical Exam  Immunization History  Administered Date(s) Administered   HPV Quadrivalent 02/07/2011, 03/27/2012, 09/04/2013   Hepatitis B 04/02/1996, 07/22/1996   Influenza-Unspecified 11/24/2009   Meningococcal Conjugate 06/26/2007, 09/04/2013   Tdap 06/26/2007   Varicella 01/27/1997    Health Maintenance  Topic Date Due   COVID-19 Vaccine (1) Never done   DTaP/Tdap/Td (2 - Td or Tdap) 06/25/2017   INFLUENZA VACCINE  09/20/2022   PAP-Cervical Cytology Screening  04/24/2025   PAP SMEAR-Modifier  04/24/2025   HPV VACCINES  Completed   Hepatitis C Screening  Completed   HIV Screening  Completed    Discussed health benefits of physical activity, and encouraged her to engage in regular exercise appropriate for her age and condition.  Vitamin D deficiency -     VITAMIN D 25 Hydroxy (Vit-D Deficiency, Fractures)  Other hyperlipidemia -     CBC with Differential/Platelet -     CMP14+EGFR -     Lipid panel  Other specified disorders of thyroid -     TSH + free T4  Impaired fasting blood sugar -     Hemoglobin A1c  Female dyspareunia -     Ambulatory referral to Obstetrics /  Gynecology  Encounter for routine adult physical exam with abnormal findings Assessment & Plan: Physical exam done, labs ordered  Updated screening and health maintenance  Exercise and nutrition counseling BMI  18.66 Discussed healthy eating habits which includes vegetables,fruits,whole grains, fat free or low fat diary,fish,poultry,beans,nuts and seeds,vegetable oils. Find an activity that you  will enjoy and start to be active at least 5 days a week for 30 minutes each day.    Dizziness Assessment & Plan: Possible due to dehydration Labs ordered to further evaluate  Patient reported having occasional dizzy spells this year. Patient stated she only drinks one bottle of water per day. Discussed the importance of hydration to prevent dehydration. Advise to increase fluid intake about 2-2.7 liters of fluid a day.     Return if symptoms worsen or fail to improve.     Cruzita Lederer Newman Nip, FNP

## 2022-06-08 NOTE — Assessment & Plan Note (Signed)
Physical exam done, labs ordered  Updated screening and health maintenance  Exercise and nutrition counseling BMI  18.66 Discussed healthy eating habits which includes vegetables,fruits,whole grains, fat free or low fat diary,fish,poultry,beans,nuts and seeds,vegetable oils. Find an activity that you will enjoy and start to be active at least 5 days a week for 30 minutes each day.

## 2022-06-08 NOTE — Patient Instructions (Signed)
It was pleasure meeting with you today. Follow up with your primary health provider if any health concerns arises.   

## 2022-06-08 NOTE — Assessment & Plan Note (Addendum)
Possible due to dehydration Labs ordered to further evaluate  Patient reported having occasional dizzy spells this year. Patient stated she only drinks one bottle of water per day. Discussed the importance of hydration to prevent dehydration. Advise to increase fluid intake about 2-2.7 liters of fluid a day.

## 2022-06-09 LAB — CBC WITH DIFFERENTIAL/PLATELET
Basophils Absolute: 0 10*3/uL (ref 0.0–0.2)
Basos: 1 %
EOS (ABSOLUTE): 0.1 10*3/uL (ref 0.0–0.4)
Eos: 3 %
Hematocrit: 40.2 % (ref 34.0–46.6)
Hemoglobin: 13.4 g/dL (ref 11.1–15.9)
Immature Grans (Abs): 0 10*3/uL (ref 0.0–0.1)
Immature Granulocytes: 0 %
Lymphocytes Absolute: 1.4 10*3/uL (ref 0.7–3.1)
Lymphs: 40 %
MCH: 32.1 pg (ref 26.6–33.0)
MCHC: 33.3 g/dL (ref 31.5–35.7)
MCV: 96 fL (ref 79–97)
Monocytes Absolute: 0.5 10*3/uL (ref 0.1–0.9)
Monocytes: 14 %
Neutrophils Absolute: 1.5 10*3/uL (ref 1.4–7.0)
Neutrophils: 42 %
Platelets: 280 10*3/uL (ref 150–450)
RBC: 4.17 x10E6/uL (ref 3.77–5.28)
RDW: 12.6 % (ref 11.7–15.4)
WBC: 3.6 10*3/uL (ref 3.4–10.8)

## 2022-06-09 LAB — TSH+FREE T4
Free T4: 1.14 ng/dL (ref 0.82–1.77)
TSH: 0.71 u[IU]/mL (ref 0.450–4.500)

## 2022-06-09 LAB — CMP14+EGFR
ALT: 15 IU/L (ref 0–32)
AST: 21 IU/L (ref 0–40)
Albumin/Globulin Ratio: 1.7 (ref 1.2–2.2)
Albumin: 4.7 g/dL (ref 4.0–5.0)
Alkaline Phosphatase: 52 IU/L (ref 44–121)
BUN/Creatinine Ratio: 10 (ref 9–23)
BUN: 7 mg/dL (ref 6–20)
Bilirubin Total: <0.2 mg/dL (ref 0.0–1.2)
CO2: 22 mmol/L (ref 20–29)
Calcium: 9.8 mg/dL (ref 8.7–10.2)
Chloride: 101 mmol/L (ref 96–106)
Creatinine, Ser: 0.69 mg/dL (ref 0.57–1.00)
Globulin, Total: 2.7 g/dL (ref 1.5–4.5)
Glucose: 91 mg/dL (ref 70–99)
Potassium: 4.8 mmol/L (ref 3.5–5.2)
Sodium: 137 mmol/L (ref 134–144)
Total Protein: 7.4 g/dL (ref 6.0–8.5)
eGFR: 123 mL/min/{1.73_m2} (ref >59)

## 2022-06-09 LAB — LIPID PANEL
Chol/HDL Ratio: 2.3 ratio (ref 0.0–4.4)
Cholesterol, Total: 121 mg/dL (ref 100–199)
HDL: 53 mg/dL (ref >39)
LDL Chol Calc (NIH): 58 mg/dL (ref 0–99)
Triglycerides: 40 mg/dL (ref 0–149)
VLDL Cholesterol Cal: 10 mg/dL (ref 5–40)

## 2022-06-09 LAB — HEMOGLOBIN A1C
Est. average glucose Bld gHb Est-mCnc: 117 mg/dL
Hgb A1c MFr Bld: 5.7 % — ABNORMAL HIGH (ref 4.8–5.6)

## 2022-06-09 LAB — VITAMIN D 25 HYDROXY (VIT D DEFICIENCY, FRACTURES): Vit D, 25-Hydroxy: 29 ng/mL — ABNORMAL LOW (ref 30.0–100.0)

## 2022-06-19 ENCOUNTER — Encounter: Payer: Self-pay | Admitting: Obstetrics and Gynecology

## 2022-06-19 ENCOUNTER — Ambulatory Visit: Payer: Commercial Managed Care - HMO | Admitting: Obstetrics and Gynecology

## 2022-06-19 VITALS — BP 122/88 | HR 71 | Ht 62.0 in | Wt 102.4 lb

## 2022-06-19 DIAGNOSIS — N941 Unspecified dyspareunia: Secondary | ICD-10-CM | POA: Diagnosis not present

## 2022-06-19 MED ORDER — CEPHALEXIN 500 MG PO CAPS: ORAL_CAPSULE | ORAL | 0 refills | Status: DC

## 2022-06-19 NOTE — Progress Notes (Signed)
Ms Deupree with c/o dyspareunia foe the last few weeks.  She denies any vaginal discharge, bleeding, or odor. No new exposure. Same sexual partner, condoms for contraception. Monthly cycles.  Pain is independent of sexual position.   Denies any bowel or bladder dysfunction  PE AF VSS Chaperone present  Lungs clear Heart RRR Abd soft + BS GU Nl EGBUS, nl white vaginal discharge, cervix no lesions, + bladder tenderness, uterus, small, mobile, non tender, no adnexal masses or tenderness.  A/P Dyspareunia  Suspect related to chronic cystitis. Will treat with Keflex. F/U if Sx do not improve.

## 2022-09-07 ENCOUNTER — Ambulatory Visit: Payer: Commercial Managed Care - HMO | Admitting: Family Medicine

## 2022-09-13 ENCOUNTER — Encounter: Payer: Self-pay | Admitting: Family Medicine

## 2022-09-13 ENCOUNTER — Ambulatory Visit (INDEPENDENT_AMBULATORY_CARE_PROVIDER_SITE_OTHER): Payer: Commercial Managed Care - HMO | Admitting: Family Medicine

## 2022-09-13 VITALS — BP 117/79 | HR 71 | Ht 62.0 in | Wt 106.0 lb

## 2022-09-13 DIAGNOSIS — Z114 Encounter for screening for human immunodeficiency virus [HIV]: Secondary | ICD-10-CM | POA: Diagnosis not present

## 2022-09-13 DIAGNOSIS — R2689 Other abnormalities of gait and mobility: Secondary | ICD-10-CM | POA: Diagnosis not present

## 2022-09-13 DIAGNOSIS — R634 Abnormal weight loss: Secondary | ICD-10-CM

## 2022-09-13 DIAGNOSIS — Z113 Encounter for screening for infections with a predominantly sexual mode of transmission: Secondary | ICD-10-CM | POA: Diagnosis not present

## 2022-09-13 DIAGNOSIS — Z23 Encounter for immunization: Secondary | ICD-10-CM

## 2022-09-13 NOTE — Progress Notes (Signed)
Patient Office Visit   Subjective   Patient ID: Isabel Blackwell, female    DOB: 24-Sep-1995  Age: 27 y.o. MRN: [id number]  CC:  Chief Complaint  Patient presents with   Exposure to STD    Pt would like to be screened.    Obesity    Has weight concerns.    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   HPI    Outpatient Encounter Medications as of 09/13/2022  Medication Sig   [DISCONTINUED] cephALEXin (KEFLEX) 500 MG capsule 1 tablet po bid x 7 days and then 1 tablet qhs x 7 days   No facility-administered encounter medications on file as of 09/13/2022.    Past Surgical History:  Procedure Laterality Date   COLONOSCOPY N/A 04/13/2014   BX NL-ILEUM, COLON, RECTUM   TONSILLECTOMY     TONSILLECTOMY AND ADENOIDECTOMY Bilateral 12/09/2012   Procedure: BILATERAL TONSILLECTOMY AND ADENOIDECTOMY;  Surgeon: Darletta Moll, MD;  Location: [facility];  Service: ENT;  Laterality: Bilateral;   WISDOM TOOTH EXTRACTION      Review of Systems  Constitutional:  Negative for chills and fever.  Eyes:  Negative for blurred vision.  Respiratory:  Negative for shortness of breath.   Cardiovascular:  Negative for chest pain.  Gastrointestinal:  Negative for abdominal pain.      Objective    BP 117/79   Pulse 71   Ht 5\' 2"  (1.575 m)   Wt 106 lb 0.6 oz (48.1 kg)   SpO2 97%   BMI 19.39 kg/m   Physical Exam Vitals reviewed.  Constitutional:      General: She is not in acute distress.    Appearance: Normal appearance. She is not ill-appearing, toxic-appearing or diaphoretic.  HENT:     Head: Normocephalic.  Eyes:     General:        Right eye: No discharge.        Left eye: No discharge.      Conjunctiva/sclera: Conjunctivae normal.  Cardiovascular:     Rate and Rhythm: Normal rate.     Pulses: Normal pulses.     Heart sounds: Normal heart sounds.  Pulmonary:     Effort: Pulmonary effort is normal. No respiratory distress.     Breath sounds: Normal breath sounds.  Abdominal:     General: Bowel sounds are normal.     Palpations: Abdomen is soft.     Tenderness: There is no abdominal tenderness. There is no right CVA tenderness, left CVA tenderness or guarding.  Musculoskeletal:        General: Normal range of motion.     Cervical back: Normal range of motion.  Skin:    General: Skin is warm and dry.     Capillary Refill: Capillary refill takes less than 2 seconds.  Neurological:     General: No focal deficit present.     Mental Status: She is alert and oriented to person, place, and time.     Coordination: Coordination normal.     Gait: Gait normal.  Psychiatric:        Mood and Affect: Mood normal.        Behavior: Behavior normal.       Assessment & Plan:  Immunization due -  Tdap vaccine greater than or equal to 7yo IM  Screening examination for STI -     NuSwab Vaginitis Plus (VG+) -     HIV Antibody (routine testing w rflx)  Screening for HIV (human immunodeficiency virus) -     HIV Antibody (routine testing w rflx)  Poor balance of body weight -     Vitamin B12 -     Amb ref to Medical Nutrition Therapy-MNT -     Iron, TIBC and Ferritin Panel -     CBC with Differential/Platelet  Routine screening for STI (sexually transmitted infection) Assessment & Plan: Nuswab and Hiv screening ordered Advise to uses condoms in each sexual encounter to prevent HIV and STD. Use water-based or silicone-based lubricants to help prevent condoms from breaking or slipping during sex      Loss of weight Assessment & Plan: BMI 19.39, Vitamin D, B12, Iron, CBC labs ordered Advise patient to eat a balanced diet. In each meal include one food that is high in  protein such as meat, fish, eggs, cheese, milk, beans,poultry. Eat Nutrient rich food that are east to swallow and digest such as fruit and yougurt smoothies. Try to eat eaat six saml meals each day instead of three large meals. Follow up in 3 months to recheck weight. Dietitian referral  placed     Return in 3 months (on 12/14/2022) for Weight managment .   Cruzita Lederer Newman Nip, FNP

## 2022-09-13 NOTE — Assessment & Plan Note (Signed)
Nuswab and Hiv screening ordered Advise to uses condoms in each sexual encounter to prevent HIV and STD. Use water-based or silicone-based lubricants to help prevent condoms from breaking or slipping during sex

## 2022-09-13 NOTE — Assessment & Plan Note (Signed)
BMI 19.39, Vitamin D, B12, Iron, CBC labs ordered Advise patient to eat a balanced diet. In each meal include one food that is high in protein such as meat, fish, eggs, cheese, milk, beans,poultry. Eat Nutrient rich food that are east to swallow and digest such as fruit and yougurt smoothies. Try to eat eaat six saml meals each day instead of three large meals. Follow up in 3 months to recheck weight. Dietitian referral  placed

## 2022-09-13 NOTE — Patient Instructions (Signed)

## 2022-09-17 ENCOUNTER — Other Ambulatory Visit: Payer: Self-pay | Admitting: Family Medicine

## 2022-09-17 MED ORDER — METRONIDAZOLE 0.75 % VA GEL: 1.0000 | Freq: Every day | VAGINAL | 0 refills | Status: DC

## 2022-09-18 ENCOUNTER — Other Ambulatory Visit: Payer: Self-pay | Admitting: Family Medicine

## 2022-09-18 ENCOUNTER — Telehealth: Payer: Self-pay | Admitting: Family Medicine

## 2022-09-18 MED ORDER — METRONIDAZOLE 0.75 % VA GEL: 1.0000 | Freq: Every day | VAGINAL | 0 refills | Status: AC

## 2022-09-18 NOTE — Telephone Encounter (Signed)
Shanda Bumps called from [facility] [phone]  For metrolgel  for 7 days but it is for only a 5 day course. Please call pharmacy back needs clarification was this a typo.

## 2022-09-25 ENCOUNTER — Other Ambulatory Visit: Payer: Self-pay | Admitting: Family Medicine

## 2022-09-25 ENCOUNTER — Encounter: Payer: Self-pay | Admitting: Family Medicine

## 2022-09-25 MED ORDER — FLUCONAZOLE 150 MG PO TABS: 150.0000 mg | ORAL_TABLET | Freq: Once | ORAL | 0 refills | Status: AC

## 2022-09-26 DIAGNOSIS — H5213 Myopia, bilateral: Secondary | ICD-10-CM | POA: Diagnosis not present

## 2022-11-05 ENCOUNTER — Ambulatory Visit: Payer: Commercial Managed Care - HMO | Admitting: Nutrition

## 2022-11-19 ENCOUNTER — Encounter: Payer: Self-pay | Admitting: Internal Medicine

## 2022-11-19 ENCOUNTER — Ambulatory Visit: Payer: Medicaid Other | Admitting: Internal Medicine

## 2022-11-19 VITALS — BP 117/77 | HR 72 | Ht 62.0 in | Wt 103.0 lb

## 2022-11-19 DIAGNOSIS — N898 Other specified noninflammatory disorders of vagina: Secondary | ICD-10-CM | POA: Diagnosis not present

## 2022-11-19 NOTE — Assessment & Plan Note (Signed)
Presenting today for an acute visit in the setting of vaginal discharge as noted above.  Patient is concerned that she has BV.  Recently treated for BV with metronidazole ointment in July.  Denies symptoms aside from white vaginal discharge with foul odor. -NuSwab ordered today.  Treatment pending result.  She will return to care for follow-up with her PCP next month.

## 2022-11-19 NOTE — Progress Notes (Signed)
   Acute Office Visit  Subjective:     Patient ID: Isabel Blackwell, female    DOB: [birth year], 27 y.o.   MRN: [id number]  Chief Complaint  Patient presents with   Follow-up    Follow up requesting BV testing patient reports no symptoms    Isabel Blackwell presents today for an acute visit requesting testing for bacterial vaginosis.  She has recently developed white vaginal discharge with a foul odor, similar to symptoms she has previously experienced when she has had BV.  Per review of prior documentation, last treated for BV in July with metronidazole ointment.  Isabel Blackwell denies vaginal irritation, burning, and painful lesions.  She has not been sexually active in the last 2 months.  Further denies symptoms currently.  She is concerned that her symptoms and previous infections have been triggered by soaps that she uses when showering.  Review of Systems  Genitourinary:        White vaginal discharge with foul odor  All other systems reviewed and are negative.     Objective:    BP 117/77 (BP Location: Right Arm, Patient Position: Sitting, Cuff Size: Normal)   Pulse 72   Ht 5\' 2"  (1.575 m)   Wt 103 lb (46.7 kg)   SpO2 98%   BMI 18.84 kg/m   Physical Exam Vitals reviewed.  Constitutional:      Appearance: Normal appearance.  Cardiovascular:     Rate and Rhythm: Normal rate and regular rhythm.  Pulmonary:     Effort: Pulmonary effort is normal.     Breath sounds: Normal breath sounds.  Abdominal:     General: Abdomen is flat. Bowel sounds are normal.     Palpations: Abdomen is soft.     Tenderness: There is no abdominal tenderness.  Neurological:     Mental Status: She is alert.       Assessment & Plan:   Problem List Items Addressed This Visit       Vaginal discharge - Primary    Presenting today for an acute visit in the setting of vaginal discharge as noted above.  Patient is concerned that she has BV.  Recently treated for BV with metronidazole ointment in July.   States that she would prefer pill form metronidazole if indicated, not ointment. Denies symptoms aside from white vaginal discharge with foul odor. -NuSwab ordered today.  Treatment pending result.  She will return to care for follow-up with her PCP next month.      Return if symptoms worsen or fail to improve.  Billie Lade, MD

## 2022-11-19 NOTE — Patient Instructions (Signed)
It was a pleasure to see you today.  Thank you for giving [place] the opportunity to be involved in your care.  Below is a brief recap of your visit and next steps.  We will plan to see you again on 10/25  Summary Vaginal swab ordered. Will prescribe treatment based on results Follow up with [facility] as scheduled on 10/25

## 2022-11-21 ENCOUNTER — Telehealth: Payer: Self-pay | Admitting: Family Medicine

## 2022-11-21 NOTE — Telephone Encounter (Signed)
Pt called in wants a call back ion regard to lab results

## 2022-11-21 NOTE — Telephone Encounter (Signed)
Msg sent to pt that labs were not back and someone would be contacting her soon

## 2022-11-22 ENCOUNTER — Other Ambulatory Visit: Payer: Self-pay | Admitting: Internal Medicine

## 2022-11-22 DIAGNOSIS — B9689 Other specified bacterial agents as the cause of diseases classified elsewhere: Secondary | ICD-10-CM

## 2022-11-22 LAB — NUSWAB BV AND CANDIDA, NAA
Atopobium vaginae: HIGH {score} — AB
BVAB 2: HIGH {score} — AB
Candida albicans, NAA: NEGATIVE
Candida glabrata, NAA: NEGATIVE
Megasphaera 1: HIGH {score} — AB

## 2022-11-22 MED ORDER — METRONIDAZOLE 500 MG PO TABS: 500.0000 mg | ORAL_TABLET | Freq: Two times a day (BID) | ORAL | 0 refills | Status: DC

## 2022-12-13 ENCOUNTER — Encounter: Payer: Self-pay | Admitting: Family Medicine

## 2022-12-13 NOTE — Progress Notes (Signed)
Established Patient Office Visit   Subjective  Patient ID: Isabel Blackwell, female    DOB: 1995/08/05  Age: 27 y.o. MRN: [id number]  Chief Complaint  Patient presents with   Follow-up    Wt. Loss management. 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  Pelvic Pain  Patient presents to the clinic for persistent pelvic pain, which has been present since January 2024 and has gradually worsened over time. The pain is constant and primarily affects the right side. Associated symptoms include abdominal pain, increased urinary frequency, painful intercourse, and urinary urgency. The pain is aggravated by physical activity and tactile pressure. Pertinent negatives include no genital itching, odor, missed menses, vaginal bleeding, vaginal discharge, dysuria, hematuria, or nausea.The patient has not tried any treatment for symptom relief. She is sexually active, does not use contraception, and is unsure of her partner's STD status. Her menstrual history is regular, and her medical history is notable for a previous STD.       Review of Systems  Constitutional:  Negative for chills and fever.  Respiratory:  Negative for shortness of breath.   Cardiovascular:  Negative for chest pain.  Gastrointestinal:  Positive for abdominal pain.  Genitourinary:  Positive for frequency and urgency. Negative for dysuria, flank pain and hematuria.  Neurological:  Negative for dizziness and headaches.      Objective:     BP 101/70   Pulse 64   Ht 5\' 2"  (1.575 m)   Wt 99 lb 12.8 oz (45.3 kg)   LMP 11/27/2022 (Approximate)   SpO2 94%   BMI 18.25 kg/m  BP Readings from Last 3 Encounters:  12/14/22 101/70  11/19/22 117/77  09/13/22 117/79      Physical Exam Vitals reviewed.  Constitutional:      General: She is not in acute distress.    Appearance: Normal  appearance. She is not ill-appearing, toxic-appearing or diaphoretic.  HENT:     Head: Normocephalic.  Eyes:     General:        Right eye: No discharge.        Left eye: No discharge.     Conjunctiva/sclera: Conjunctivae normal.  Cardiovascular:     Rate and Rhythm: Normal rate.     Pulses: Normal pulses.     Heart sounds: Normal heart sounds.  Pulmonary:     Effort: Pulmonary effort is normal. No respiratory distress.     Breath sounds: Normal breath sounds.  Abdominal:     General: Bowel sounds are normal.     Palpations: Abdomen is soft.     Tenderness: There is abdominal tenderness. There is guarding. There is no right CVA tenderness or left CVA tenderness.  Musculoskeletal:     Cervical back: Normal range of motion.  Skin:    General: Skin is warm and dry.     Capillary Refill: Capillary refill takes less than 2 seconds.  Neurological:     Mental Status: She is alert.  Psychiatric:        Mood and Affect: Mood normal.        Behavior: Behavior normal.      No results found for any visits on 12/14/22.  The ASCVD Risk score (Arnett DK, et al., 2019) failed to calculate for the following reasons:   The 2019 ASCVD risk score is only valid for ages 11  to 68    Assessment & Plan:  Pelvic pain Assessment & Plan: Urine Pregnancy, Urinalysis, NuSwab and Transvaginal US ordered Advise patient can take OTC tylenol for pain or using a heating pad on the lower abdomen to help relax muscles and reduce discomfort. Gentle stretching, staying well-hydrated, and avoiding activities that intensify the pain. If pain persists or worsens follow up or visit ED  Referral placed to GYN  Orders: -     US PELVIC COMPLETE WITH TRANSVAGINAL; Future -     Ambulatory referral to Obstetrics / Gynecology -     NuSwab Vaginitis Plus (VG+) -     Pregnancy, urine -     Urinalysis, Routine w reflex microscopic    Return in about 6 months (around 06/14/2023), or if symptoms worsen or fail to  improve, for routine labs.   Cruzita Lederer Newman Nip, FNP

## 2022-12-13 NOTE — Patient Instructions (Signed)

## 2022-12-14 ENCOUNTER — Encounter: Payer: Self-pay | Admitting: Family Medicine

## 2022-12-14 ENCOUNTER — Ambulatory Visit: Payer: Medicaid Other | Admitting: Family Medicine

## 2022-12-14 VITALS — BP 101/70 | HR 64 | Ht 62.0 in | Wt 99.8 lb

## 2022-12-14 DIAGNOSIS — R102 Pelvic and perineal pain: Secondary | ICD-10-CM | POA: Diagnosis not present

## 2022-12-14 NOTE — Assessment & Plan Note (Addendum)
Urine Pregnancy, Urinalysis, NuSwab and Transvaginal US ordered Advise patient can take OTC tylenol for pain or using a heating pad on the lower abdomen to help relax muscles and reduce discomfort. Gentle stretching, staying well-hydrated, and avoiding activities that intensify the pain. If pain persists or worsens follow up or visit ED  Referral placed to GYN

## 2022-12-17 LAB — PREGNANCY, URINE: Preg Test, Ur: NEGATIVE

## 2022-12-19 ENCOUNTER — Ambulatory Visit (HOSPITAL_COMMUNITY)
Admission: RE | Admit: 2022-12-19 | Discharge: 2022-12-19 | Disposition: A | Discharge status: Home / Self Care | Payer: Medicaid Other | Source: Ambulatory Visit | Attending: Family Medicine | Admitting: Family Medicine

## 2022-12-19 ENCOUNTER — Other Ambulatory Visit: Payer: Self-pay | Admitting: Family Medicine

## 2022-12-19 DIAGNOSIS — R102 Pelvic and perineal pain: Secondary | ICD-10-CM | POA: Diagnosis not present

## 2022-12-19 DIAGNOSIS — N854 Malposition of uterus: Secondary | ICD-10-CM | POA: Diagnosis not present

## 2022-12-19 DIAGNOSIS — N83201 Unspecified ovarian cyst, right side: Secondary | ICD-10-CM | POA: Diagnosis not present

## 2022-12-19 LAB — NUSWAB VAGINITIS PLUS (VG+)
Candida albicans, NAA: POSITIVE — AB
Candida glabrata, NAA: NEGATIVE
Chlamydia trachomatis, NAA: NEGATIVE
Neisseria gonorrhoeae, NAA: NEGATIVE
Trich vag by NAA: NEGATIVE

## 2022-12-19 MED ORDER — FLUCONAZOLE 150 MG PO TABS: 150.0000 mg | ORAL_TABLET | Freq: Once | ORAL | 0 refills | Status: AC

## 2022-12-26 ENCOUNTER — Encounter: Payer: Medicaid Other | Attending: Family Medicine | Admitting: Nutrition

## 2022-12-26 VITALS — Ht 62.0 in | Wt 100.4 lb

## 2022-12-26 DIAGNOSIS — R634 Abnormal weight loss: Secondary | ICD-10-CM

## 2022-12-26 DIAGNOSIS — Z713 Dietary counseling and surveillance: Secondary | ICD-10-CM | POA: Insufficient documentation

## 2022-12-26 DIAGNOSIS — R636 Underweight: Secondary | ICD-10-CM | POA: Insufficient documentation

## 2022-12-26 DIAGNOSIS — R2689 Other abnormalities of gait and mobility: Secondary | ICD-10-CM | POA: Diagnosis not present

## 2022-12-26 NOTE — Progress Notes (Unsigned)
Medical Nutrition Therapy  Appointment Start time:  1100  Appointment End time:  1200  Primary concerns today: Wants to gain weight ; underweight Referral diagnosis: R26.89, R63.6 Preferred learning style: See  Learning readiness: Ready    NUTRITION ASSESSMENT  27 yr old bfemale here wanting to gain weight. Would like to weigh 120-130 lbs. Use to be 120 lbs when she was on depo shot. Not on any birthcontrol right now. Currently in college and working. She doesn't cook much at home. Eats out mostly.  Doesn't like a lot of foods and prefers more processed and fast foods.  Not eating breakfast. Some days she eats 2-3 meals, some days she notes she doesn't eat at all. Reports she can't eat if she doesn't have an appetite.  Limited foods she likes. Resistant to changing her habits or food choices. She reports having a better appetite during her cycle and then afterwards, her appetite drops off and she isn't hungry. She has drank Ensure plus but she notes it hasn't helped her gain weight and isn't going to drink them anymore. She was not interested in a follow up..  >>>A1C 5.7% noted, VIt D slighly low >>>>  Calorie intake insuffient to meet her needs consistently to encourage weight gain. Her food intake is inconsistent and insuffient to meet her nutritional needs for health.  She is not in a state of readiness to change.   Clinical Medical Hx:  Past Medical History:  Diagnosis Date   Depression    Nasal congestion 12/04/2012   Tonsillar and adenoid hypertrophy 11/2012   snores during sleep, mother denies apnea    Medications:  Current Outpatient Medications on File Prior to Visit  Medication Sig Dispense Refill   metroNIDAZOLE (FLAGYL) 500 MG tablet Take 1 tablet (500 mg total) by mouth 2 (two) times daily. (Patient not taking: Reported on 12/14/2022) 14 tablet 0   No current facility-administered medications on file prior to visit.    Labs:  Lab Results  Component Value Date    HGBA1C 5.7 (H) 06/08/2022      Latest Ref Rng & Units 06/08/2022    9:57 AM 05/12/2022   12:42 AM 12/22/2016    9:46 AM  CMP  Glucose 70 - 99 mg/dL 91  96  91   BUN 6 - 20 mg/dL 7  10  12    Creatinine 0.57 - 1.00 mg/dL 1.19  1.47  8.29   Sodium 134 - 144 mmol/L 137  132  136   Potassium 3.5 - 5.2 mmol/L 4.8  3.3  4.0   Chloride 96 - 106 mmol/L 101  103  102   CO2 20 - 29 mmol/L 22  21  26    Calcium 8.7 - 10.2 mg/dL 9.8  8.7  9.1   Total Protein 6.0 - 8.5 g/dL 7.4   7.7   Total Bilirubin 0.0 - 1.2 mg/dL <5.6   0.5   Alkaline Phos 44 - 121 IU/L 52   45   AST 0 - 40 IU/L 21   16   ALT 0 - 32 IU/L 15   12    Lipid Panel     Component Value Date/Time   CHOL 121 06/08/2022 0957   TRIG 40 06/08/2022 0957   HDL 53 06/08/2022 0957   CHOLHDL 2.3 06/08/2022 0957   LDLCALC 58 06/08/2022 0957   LABVLDL 10 06/08/2022 0957    Notable Signs/Symptoms: none  Lifestyle & Dietary Hx Lives by herself. Eating patttern varies from  not eating all day to eating 3 meals per day. Appetite varies. Can't force herself to eat.    Estimated daily fluid intake: 36 oz Supplements: none Sleep: varies sometimes sleep well and other not due to studying or can't sleep Stress / self-care: lifes and work Current average weekly physical activity: Active working and in school  24-Hr Dietary Recall First Meal: skipped Snack: 30 oz water Second Meal: 1-2  pm spaghetti Snack:  Third Meal: chicken fries, pintos,  water Snack: Ensure Plus Beverages: water, Ensure just started.  Estimated Energy Needs Calories: 1800-2000 Carbohydrate: 200g Protein: 135g Fat: 50g   NUTRITION DIAGNOSIS  NI-1.6 Predicted suboptional energy  As related to Inconsistent eating pattern and insuffient calorie and protein intake.  As evidenced by BMI 18 and underweight.   NUTRITION INTERVENTION  Nutrition education (E-1) on the following topics:  High Calorie High Protein diet meal planning, portion sizes, timing of  meals, healthy  snacks between meals, benefits of exercising 30 minutes per day and healthy food for desired weight gain.   Handouts Provided Include  My Plate  Learning Style & Readiness for Change Teaching method utilized: Visual & Auditory  Demonstrated degree of understanding via: Teach Back  Barriers to learning/adherence to lifestyle change: Willingness to change  Goals Established by Pt Goals  Consider taking a MVI daily with iron. Talk to MD about taking Vit D supplement due to lower Vit D level. Try eating a cup of yogurt in am to help improve gut health and improve appetite. Try to eat small frequent meals. Need to increase calorie intake for desired weight gain with foods that you like.   MONITORING & EVALUATION Dietary intake, weekly physical activity, PRN.  Next Steps  Patient is to work on eating 3 meals per day or small frequent meals throughout the day to increase calorie, protein and vitamin/mineral intake for overall health.Marland Kitchen

## 2022-12-26 NOTE — Patient Instructions (Signed)
Goals  Consider taking a MVI daily with iron. Talk to MD about taking Vit D supplement due to lower Vit D level. Try eating a cup of yogurt in am to help improve gut health and improve appetite. Try to eat small frequent meals. Need to increase calorie intake for desired weight gain with foods that you like.

## 2022-12-27 ENCOUNTER — Encounter: Payer: Self-pay | Admitting: Nutrition

## 2022-12-27 ENCOUNTER — Ambulatory Visit: Payer: Medicaid Other | Admitting: Advanced Practice Midwife

## 2023-01-02 ENCOUNTER — Other Ambulatory Visit: Payer: Self-pay | Admitting: Family Medicine

## 2023-01-02 ENCOUNTER — Encounter: Payer: Self-pay | Admitting: Family Medicine

## 2023-01-02 DIAGNOSIS — N898 Other specified noninflammatory disorders of vagina: Secondary | ICD-10-CM

## 2023-01-02 NOTE — Telephone Encounter (Signed)
Note provided. Thanks.

## 2023-01-02 NOTE — Telephone Encounter (Signed)
Please let patient know to come in for Nuswab

## 2023-01-03 ENCOUNTER — Encounter: Payer: Self-pay | Admitting: Advanced Practice Midwife

## 2023-01-03 ENCOUNTER — Ambulatory Visit: Payer: Medicaid Other | Admitting: Advanced Practice Midwife

## 2023-01-03 ENCOUNTER — Ambulatory Visit: Payer: Medicaid Other

## 2023-01-03 VITALS — BP 115/78 | HR 81 | Ht 62.0 in | Wt 103.0 lb

## 2023-01-03 DIAGNOSIS — N898 Other specified noninflammatory disorders of vagina: Secondary | ICD-10-CM

## 2023-01-03 DIAGNOSIS — N941 Unspecified dyspareunia: Secondary | ICD-10-CM

## 2023-01-03 DIAGNOSIS — N83201 Unspecified ovarian cyst, right side: Secondary | ICD-10-CM | POA: Diagnosis not present

## 2023-01-03 DIAGNOSIS — N979 Female infertility, unspecified: Secondary | ICD-10-CM

## 2023-01-03 DIAGNOSIS — R102 Pelvic and perineal pain: Secondary | ICD-10-CM | POA: Diagnosis not present

## 2023-01-03 MED ORDER — PNV PRENATAL PLUS MULTIVITAMIN 27-1 MG PO TABS: 1.0000 | ORAL_TABLET | Freq: Every day | ORAL | 11 refills | Status: AC

## 2023-01-03 MED ORDER — CLOMIPHENE CITRATE 50 MG PO TABS: 50.0000 mg | ORAL_TABLET | Freq: Every day | ORAL | 5 refills | Status: AC

## 2023-01-03 NOTE — Care Management (Signed)
Pt. Completed self swab in office. No complications, TOC to Lab.

## 2023-01-03 NOTE — Patient Instructions (Signed)
CLOMID INSTRUCTIONS  WHY USE IT? Clomid helps your ovaries to release eggs (ovulate).  HOW TO USE IT? Clomid is taken as a pill usually on days 1,2,3,4,&5 of your cycle.  Day 1 is the first day of your period. The dose or duration may be changed to achieve ovulation.  Provera (progesterone) may first be used to bring on a period for some patients. The day of ovulation on Clomid is usually between cycle day 14 and 17.  Having sexual intercourse at least every other day between cycle day 13 and 18 will improve your chances of becoming pregnant during the Clomid cycle.  You may monitor your ovulation using basal body temperature charts or with ovulation kits.  If using the ovulation predictor kits, having intercourse the day of the surge and the two days following is recommended. If you get your period, call when it starts for an appointment with your doctor, so that an exam may be done, and another Clomid cycle can be considered if appropriate. If you do not get a period by day 35 of the cycle, please get a blood pregnancy test.  If it is negative, speak to your doctor for instructions to bring on another period and to plan a follow-up appointment.  THINGS TO KNOW: If you get pregnant while using Clomid, your chance of twins is 7%m and triplets is less than 1%. Some studies have suggested the use of "fertility drugs" may increase your risk of ovarian cancers in the future.  It is unclear if these drugs increase the risk, or people who have problems with fertility are prone for these cancers.  If there is an actual risk, it is very low.  If you have a history of liver problems or ovarian cancer, it may be wise to avoid this medication.  SIDE EFFECTS:  The most common side effect is hot flashes (20%).  Breast tenderness, headaches, nausea, bloating may also occur at different times.  Less than 3/1,000 people have dryness or loss of hair.  Persistent ovarian cysts may form from the use of this  medication.  Ovarian hyperstimulation syndrome is a rare side effect at low doses.  Visual changes like flashes of light or blurring.    

## 2023-01-03 NOTE — Progress Notes (Signed)
[facility] Visit  Patient name: Isabel Blackwell MRN [id number]  Date of birth: April 16, 1995  CC & HPI:  Isabel Blackwell is a 27 y.o.  female presenting today for pelvic pain. She has suffered with this off and on since 2017.  Primarily w/IC in a back or side lying position, with deep penetration. Pain shoots to her rectal area and migrates around to front.  Pain lasts for several hours, can't strain to have a BM because it still hurts.  Does not hurt when she is on top. Pelvic US a few weeks ago shows a hemorrhagic cyst, STD panel negative.  Denies urinary symptoms, does not have to get up at night to void.    Has been w/same partner for 9 years, trying to get pregnant.  He has several children.  Pt wants to continue to try to conceive.   Pertinent History Reviewed:  Medical & Surgical Hx:   Past Medical History:  Diagnosis Date   Depression    Nasal congestion 12/04/2012   Tonsillar and adenoid hypertrophy 11/2012   snores during sleep, mother denies apnea   Past Surgical History:  Procedure Laterality Date   COLONOSCOPY N/A 04/13/2014   BX NL-ILEUM, COLON, RECTUM   TONSILLECTOMY     TONSILLECTOMY AND ADENOIDECTOMY Bilateral 12/09/2012   Procedure: BILATERAL TONSILLECTOMY AND ADENOIDECTOMY;  Surgeon: Darletta Moll, MD;  Location: [facility];  Service: ENT;  Laterality: Bilateral;   WISDOM TOOTH EXTRACTION     Family History  Problem Relation Age of Onset   Hypertension Mother    Heart disease Maternal Grandfather    Colon cancer Neg Hx     Current Outpatient Medications:    clomiPHENE (CLOMID) 50 MG tablet, Take 1 tablet (50 mg total) by mouth daily. On days 1-5 of menstrual cycle, Disp: 5 tablet, Rfl: 5   Prenatal Vit-Fe Fumarate-FA (PNV PRENATAL PLUS MULTIVITAMIN) 27-1 MG TABS, Take 1 tablet by mouth daily., Disp: 30 tablet, Rfl: 11 Social History: Reviewed -  reports that she has been smoking cigarettes. She has never used smokeless tobacco.  Review  of Systems:   Constitutional: Negative for fever and chills Eyes: Negative for visual disturbances Respiratory: Negative for shortness of breath, dyspnea Cardiovascular: Negative for chest pain or palpitations  Gastrointestinal: Negative for vomiting, diarrhea and constipation;  Genitourinary: Negative for dysuria and urgency, vaginal irritation or itching Musculoskeletal: Negative for back pain, joint pain, myalgias  Neurological: Negative for dizziness and headaches    Objective Findings:    Physical Examination: Vitals:   01/03/23 0835  BP: 115/78  Pulse: 81   General appearance - well appearing, and in no distress Mental status - alert, oriented to person, place, and time Chest:  Normal respiratory effort Heart - normal rate and regular rhythm Abdomen:  Soft, nontender to palpation Pelvic: Bimanual doesn't elicit similar pain w/dyspareunia.  Neg CMT Musculoskeletal:  Normal range of motion without pain Extremities:  No edema   [place] from 10/30 1. 2.3 cm complex right ovarian cyst, favored to reflect a hemorrhagic corpus luteal cyst. Short interval follow-up ultrasound in 6-12 weeks to ensure resolution could be performed as warranted. 2. Trace free fluid within the pelvis, nonspecific, but most commonly physiologic. 3. Otherwise unremarkable and normal pelvic ultrasound.    No results found for this or any previous visit (from the past 24 hour(s)).    Assessment & Plan:  A:   Complex cyst, right ovary  Longstanding Dyspareunia  Difficulty conceiving  P:   Orders Placed This Encounter  Procedures   US PELVIS TRANSVAGINAL NON-OB (TV ONLY)    Standing Status:   Future    Standing Expiration Date:   05/03/2023    Order Specific Question:   Reason for exam:    Answer:   F/U cyst right ovary    Order Specific Question:   Preferred imaging location?    Answer:   Internal   AMB referral to rehabilitation    Referral Priority:   Routine    Referral Type:    Consultation    Number of Visits Requested:   1   Meds ordered this encounter  Medications   Prenatal Vit-Fe Fumarate-FA (PNV PRENATAL PLUS MULTIVITAMIN) 27-1 MG TABS    Sig: Take 1 tablet by mouth daily.    Dispense:  30 tablet    Refill:  11    Order Specific Question:   Supervising Provider    Answer:   Duane Lope H [[id number]]   clomiPHENE (CLOMID) 50 MG tablet    Sig: Take 1 tablet (50 mg total) by mouth daily. On days 1-5 of menstrual cycle    Dispense:  5 tablet    Refill:  5    Order Specific Question:   Supervising Provider    Answer:   Lazaro Arms [[id number]]   Will try clomid (R/B/SE discussed) for up to 6 months   Return in about 3 months (around 04/05/2023) for pelvic US/Dr Ozan.  Jacklyn Shell CNM 01/03/2023 9:29 AM

## 2023-01-06 ENCOUNTER — Other Ambulatory Visit: Payer: Self-pay | Admitting: Family Medicine

## 2023-01-06 LAB — NUSWAB VAGINITIS PLUS (VG+)
Candida albicans, NAA: POSITIVE — AB
Candida glabrata, NAA: NEGATIVE
Chlamydia trachomatis, NAA: NEGATIVE
Neisseria gonorrhoeae, NAA: NEGATIVE
Trich vag by NAA: NEGATIVE

## 2023-01-06 MED ORDER — FLUCONAZOLE 150 MG PO TABS: 150.0000 mg | ORAL_TABLET | Freq: Once | ORAL | 1 refills | Status: AC

## 2023-02-05 ENCOUNTER — Encounter (HOSPITAL_COMMUNITY): Payer: Self-pay | Admitting: *Deleted

## 2023-02-05 ENCOUNTER — Other Ambulatory Visit: Payer: Self-pay

## 2023-02-05 ENCOUNTER — Emergency Department (HOSPITAL_COMMUNITY)
Admission: EM | Admit: 2023-02-05 | Discharge: 2023-02-05 | Disposition: A | Discharge status: Home / Self Care | Payer: Medicaid Other | Attending: Emergency Medicine | Admitting: Emergency Medicine

## 2023-02-05 DIAGNOSIS — N760 Acute vaginitis: Secondary | ICD-10-CM | POA: Diagnosis not present

## 2023-02-05 DIAGNOSIS — W260XXA Contact with knife, initial encounter: Secondary | ICD-10-CM | POA: Diagnosis not present

## 2023-02-05 DIAGNOSIS — S61412A Laceration without foreign body of left hand, initial encounter: Secondary | ICD-10-CM | POA: Diagnosis not present

## 2023-02-05 DIAGNOSIS — S61211A Laceration without foreign body of left index finger without damage to nail, initial encounter: Secondary | ICD-10-CM | POA: Diagnosis not present

## 2023-02-05 DIAGNOSIS — S6992XA Unspecified injury of left wrist, hand and finger(s), initial encounter: Secondary | ICD-10-CM | POA: Diagnosis present

## 2023-02-05 DIAGNOSIS — B9689 Other specified bacterial agents as the cause of diseases classified elsewhere: Secondary | ICD-10-CM | POA: Insufficient documentation

## 2023-02-05 DIAGNOSIS — S61215A Laceration without foreign body of left ring finger without damage to nail, initial encounter: Secondary | ICD-10-CM | POA: Diagnosis not present

## 2023-02-05 LAB — WET PREP, GENITAL
Sperm: NONE SEEN
Trich, Wet Prep: NONE SEEN
WBC, Wet Prep HPF POC: >=10 — AB (ref <10)
Yeast Wet Prep HPF POC: NONE SEEN

## 2023-02-05 LAB — HIV ANTIBODY (ROUTINE TESTING W REFLEX): HIV Screen 4th Generation wRfx: NONREACTIVE

## 2023-02-05 MED ORDER — METRONIDAZOLE 500 MG PO TABS: 500.0000 mg | ORAL_TABLET | Freq: Two times a day (BID) | ORAL | 0 refills | Status: DC

## 2023-02-05 NOTE — ED Provider Notes (Incomplete)
[place] EMERGENCY DEPARTMENT AT [facility] Provider Note   CSN: [id number] Arrival date & time: 02/05/23  8657     History {Add pertinent medical, surgical, social history, OB history to HPI:1} Chief Complaint  Patient presents with   Laceration   SEXUALLY TRANSMITTED DISEASE    Isabel Blackwell is a 27 y.o. female.  Patient accidentally cut her left hand today.  Also patient would like to be checked for BV infection.  She stated this has happened before and she has discharged now   Laceration      Home Medications Prior to Admission medications   Medication Sig Start Date End Date Taking? Authorizing Provider  metroNIDAZOLE (FLAGYL) 500 MG tablet Take 1 tablet (500 mg total) by mouth 2 (two) times daily. 02/05/23  Yes Bethann Berkshire, MD  clomiPHENE (CLOMID) 50 MG tablet Take 1 tablet (50 mg total) by mouth daily. On days 1-5 of menstrual cycle 01/03/23   Cresenzo-Dishmon, Scarlette Calico, CNM  Prenatal Vit-Fe Fumarate-FA (PNV PRENATAL PLUS MULTIVITAMIN) 27-1 MG TABS Take 1 tablet by mouth daily. 01/03/23   Cresenzo-Dishmon, Scarlette Calico, CNM      Allergies    Nitrofurantoin monohyd macro    Review of Systems   Review of Systems  Physical Exam Updated Vital Signs BP 117/78 (BP Location: Right Arm)   Pulse 84   Temp 98.3 F (36.8 C) (Oral)   Resp 17   Ht 5\' 2"  (1.575 m)   Wt 47.2 kg   LMP 01/21/2023   SpO2 99%   BMI 19.02 kg/m  Physical Exam  ED Results / Procedures / Treatments   Labs (all labs ordered are listed, but only abnormal results are displayed) Labs Reviewed  WET PREP, GENITAL - Abnormal; Notable for the following components:      Result Value   Clue Cells Wet Prep HPF POC PRESENT (*)    WBC, Wet Prep HPF POC >=10 (*)    All other components within normal limits  HIV ANTIBODY (ROUTINE TESTING W REFLEX)  RPR  GC/CHLAMYDIA PROBE AMP ([place]) NOT AT [facility]    EKG None  Radiology No results found.  Procedures Procedures   {Document cardiac monitor, telemetry assessment procedure when appropriate:1}  Medications Ordered in ED Medications - No data to display  ED Course/ Medical Decision Making/ A&P   {Patient with laceration to left index and middle finger.  The lacerations are very superficial and closed with Dermabond. Click here for ABCD2, HEART and other calculatorsREFRESH Note before signing :1}                              Medical Decision Making Amount and/or Complexity of Data Reviewed Labs: ordered.  Risk Prescription drug management.   Patient with superficial laceration to left hand that is closed with Dermabond and bacterial vaginosis.  She is placed on Flagyl and will follow-up with her PCP  {Document critical care time when appropriate:1} {Document review of labs and clinical decision tools ie heart score, Chads2Vasc2 etc:1}  {Document your independent review of radiology images, and any outside records:1} {Document your discussion with family members, caretakers, and with consultants:1} {Document social determinants of health affecting pt's care:1} {Document your decision making why or why not admission, treatments were needed:1} Final Clinical Impression(s) / ED Diagnoses Final diagnoses:  Bacterial vaginosis  Laceration of left hand without foreign body, initial encounter    Rx / DC Orders ED Discharge Orders  Ordered    metroNIDAZOLE (FLAGYL) 500 MG tablet  2 times daily        02/05/23 1500

## 2023-02-05 NOTE — ED Triage Notes (Signed)
Pt c/o laceration to ring and pinky finger of right hand; pt states she was cooking last night when she cut her hand  Pt also requesting a STI test

## 2023-02-05 NOTE — Discharge Instructions (Signed)
Clean hand twice a day with soap and water gently.  Follow-up with your family doctor in a couple weeks for recheck to make sure your hand is better and that your bacterial vaginosis has improved.  They can also check on the tests that we have done that are pending

## 2023-02-06 LAB — GC/CHLAMYDIA PROBE AMP (~~LOC~~) NOT AT ARMC
Chlamydia: NEGATIVE
Comment: NEGATIVE
Comment: NORMAL
Neisseria Gonorrhea: NEGATIVE

## 2023-02-06 LAB — RPR: RPR Ser Ql: NONREACTIVE

## 2023-02-22 ENCOUNTER — Ambulatory Visit: Payer: Medicaid Other | Admitting: Family Medicine

## 2023-02-22 ENCOUNTER — Encounter: Payer: Self-pay | Admitting: Family Medicine

## 2023-02-22 VITALS — BP 120/82 | HR 76 | Ht 62.0 in | Wt 103.0 lb

## 2023-02-22 DIAGNOSIS — N760 Acute vaginitis: Secondary | ICD-10-CM | POA: Diagnosis not present

## 2023-02-22 DIAGNOSIS — M25641 Stiffness of right hand, not elsewhere classified: Secondary | ICD-10-CM | POA: Diagnosis not present

## 2023-02-22 DIAGNOSIS — S61216D Laceration without foreign body of right little finger without damage to nail, subsequent encounter: Secondary | ICD-10-CM | POA: Diagnosis not present

## 2023-02-22 DIAGNOSIS — B9689 Other specified bacterial agents as the cause of diseases classified elsewhere: Secondary | ICD-10-CM | POA: Diagnosis not present

## 2023-02-22 DIAGNOSIS — S61216A Laceration without foreign body of right little finger without damage to nail, initial encounter: Secondary | ICD-10-CM | POA: Insufficient documentation

## 2023-02-22 NOTE — Assessment & Plan Note (Signed)
 Referral placed to orthopedic surgery for collaborative care

## 2023-02-22 NOTE — Addendum Note (Signed)
 Addended byGilmore Laroche on: 02/22/2023 02:08 PM   Modules accepted: Orders

## 2023-02-22 NOTE — Assessment & Plan Note (Addendum)
 Pending NuSwab for clearance of treated bacterial vaginosis. The patient was educated on maintaining good hygiene by cleansing the external genitalia with mild, unscented soap and water , avoiding irritants such as tampons, perfumes, and sprays that can cause irritation, and maintaining a balanced diet rich in probiotics (such as yogurt) to support vaginal flora. She was also advised to limit the use of products on the vaginal area. The patient verbalized understanding and is aware of the plan of care.

## 2023-02-22 NOTE — Assessment & Plan Note (Addendum)
 The patient declines physical therapy at the moment Encouraged gentle stretching exercises and movement of the fingers through their full range of motion, as long as there is no severe pain, to prevent stiffness. Ice and elevation are recommended if swelling is noticed. A referral to orthopedic surgery is placed for collaborative care. Encouraged to monitor for  signs of infection such as redness, warmth, or pus. The patient is aware of the plan of care and verbalized understanding
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
## 2023-02-22 NOTE — Patient Instructions (Addendum)
 I appreciate the opportunity to provide care to you today!    Follow up: 2 weeks pcp  f you're unable to fully bend your pinky finger after the application of Dermabond (a skin adhesive), it's possible that there might be a few contributing factors:  Swelling: The area around the injury may still be swollen, which can restrict movement. Swelling can occur even with the use of Dermabond if there was trauma to the area.  Healing Process: Dermabond is used to close wounds, and the healing tissue around the wound may be stiff, which can temporarily limit your range of motion.  Adhesive Restrictions: Sometimes, Dermabond or the healing skin can cause a slight restriction in movement if it creates a tight or firm surface around the joint.  Injury: It's also possible that the injury itself (even if closed with Dermabond) involved damage to tendons, ligaments, or joints, affecting the finger's ability to fully bend.  What you can do: Gentle Stretching: Try gently moving the finger through its full range of motion if you're not experiencing severe pain. This can help prevent stiffness. Elevation and Ice: If there is swelling, elevating your hand and applying ice can help reduce the swelling. Monitor Healing: Ensure that the Dermabond is intact and that the skin isn't becoming too tight as it heals. Follow-up Care: If the issue persists or you're unable to move your finger fully, a referral will be placed to orthopedic surgery. If you experience pain, limited mobility, or signs of infection (redness, warmth, pus), seeking medical attention would be advised.   Attached with your AVS, you will find valuable resources for self-education. I highly recommend dedicating some time to thoroughly examine them.   Please continue to a heart-healthy diet and increase your physical activities. Try to exercise for at least five days a week.    It was a pleasure to see you and I look forward to continuing  to work together on your health and well-being. Please do not hesitate to call the office if you need care or have questions about your care.  In case of emergency, please visit the Emergency Department for urgent care, or contact our clinic at [phone] to schedule an appointment. We're here to help you!   Have a wonderful day and week. With Gratitude, Taffany Heiser MSN, FNP-BC

## 2023-02-26 ENCOUNTER — Other Ambulatory Visit: Payer: Self-pay | Admitting: Family Medicine

## 2023-02-26 DIAGNOSIS — A599 Trichomoniasis, unspecified: Secondary | ICD-10-CM

## 2023-02-26 LAB — NUSWAB VAGINITIS PLUS (VG+)
Chlamydia trachomatis, NAA: NEGATIVE
Neisseria gonorrhoeae, NAA: NEGATIVE

## 2023-02-26 MED ORDER — METRONIDAZOLE 500 MG PO TABS: 500.0000 mg | ORAL_TABLET | Freq: Two times a day (BID) | ORAL | 0 refills | Status: AC

## 2023-02-26 NOTE — Progress Notes (Signed)
 Please inform the patient that a prescription for Metronidazole 500 mg, to be taken twice daily (BID) for 7 days, has been provided to treat trichomoniasis.

## 2023-02-28 ENCOUNTER — Encounter: Payer: Self-pay | Admitting: Orthopaedic Surgery

## 2023-02-28 ENCOUNTER — Ambulatory Visit: Payer: Medicaid Other | Admitting: Orthopaedic Surgery

## 2023-02-28 VITALS — BP 118/84 | HR 76 | Ht 63.0 in | Wt 101.1 lb

## 2023-02-28 DIAGNOSIS — S66196A Other injury of flexor muscle, fascia and tendon of right little finger at wrist and hand level, initial encounter: Secondary | ICD-10-CM | POA: Diagnosis not present
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
## 2023-02-28 NOTE — Patient Instructions (Signed)
 [facility] Address: [address], [place], [place] [address] Phone: [phone]

## 2023-03-05 ENCOUNTER — Ambulatory Visit: Payer: Medicaid Other | Admitting: Orthopedic Surgery

## 2023-03-05 ENCOUNTER — Other Ambulatory Visit (INDEPENDENT_AMBULATORY_CARE_PROVIDER_SITE_OTHER): Payer: Medicaid Other

## 2023-03-05 DIAGNOSIS — M79641 Pain in right hand: Secondary | ICD-10-CM | POA: Diagnosis not present

## 2023-03-05 NOTE — Progress Notes (Addendum)
 Isabel Blackwell - 28 y.o. female MRN [id number]  Date of birth: 04-May-1995  Office Visit Note: Visit Date: 03/05/2023 PCP: Terry Wilhelmena Lloyd Hilario, FNP Referred by: Brenna Lin, MD  Subjective: No chief complaint on file.  HPI: Isabel Blackwell is a pleasant 28 y.o. female who presents today for evaluation of a right small finger laceration injury sustained approximately 4 weeks prior.  She was cooking when she cut herself with a kitchen knife over the volar aspect of the small finger at the level of the PIP.  She was seen in the emergency department setting the day of injury and underwent washout and closure of the wound at that time.  Her follow-up was quite delayed, she initially was seen by her PCP approximately 2 weeks after her ER visit, was subsequently sent to a local orthopedic surgeon who then referred her to me for specific hand evaluation with concern for a flexor tendon rupture.  Since the time of her injury, she has not been able to flex at the DIP joint of the small finger.  She does have sensation intact distal to this injury site on both the radial ulnar border, has some mild numbness in the volar distal aspect of the finger which remains persistent.  She is right-hand dominant, overall healthy and active at baseline.  Pertinent ROS were reviewed with the patient and found to be negative unless otherwise specified above in HPI.   Visit Reason: right small finger laceration  Duration of symptoms:December 17 Hand dominance: right Occupation: sports coach Diabetic: No Smoking: Yes Heart/Lung History: none Blood Thinners: none  Prior Testing/EMG: none Injections (Date): none Treatments: dermabond Prior Surgery: none  Unable to fully make fist; DIP will not flex   Assessment & Plan: Visit Diagnoses:  1. Pain in right hand     Plan: Based on her clinical workup today, her injury is consistent with a FDP tendon laceration with rupture.  We discussed the  significance of this injury and the possibility for tendon retraction as well.  Given the chronicity of her injury, roughly 4 weeks, I also discussed that it would be quite scarred in and may be difficult to completely mobilize this tendon to regain her full excursion and motion moving forward.  Fortunately, her FDS is intact at the small finger and she has maintained active flexion at the PIP.  We discussed conservative treatment would be allowing her to continue with a PIP finger of the small finger, however without surgical intervention she will not regain flexion at the DIP.  She is quite frustrated by the fact that she does not have motion at the DIP and would like to pursue aggressive measures in order to improve this.  From a surgical standpoint, we discussed wound exploration and flexor digitorum profundus repair.  I once again emphasized that the tendon could be quite retracted at this point given the chronicity of her injury and may preclude her ability to regain her full range of motion at the DIP long-term.  I did also explain that this will be a fairly large incision and they will be an element of scarring that occurs postoperatively which could preclude her ability to regain full range of motion of the digit.  As far as the numbness, she does have intact sensation over the radial and ulnar border of the small finger, however she is describing some slight numbness at the distal pulp.  As part of the surgical intervention, we would do exploration  of the neurovascular bundles of the small finger, digital nerve repair could be performed as needed based on what we encounter.  Understanding the above, she would like to pursue surgical intervention.  She is indicated for right small finger wound exploration and repair of the flexor digitorum profundus of the small finger, possible digital nerve repair as well.  We discussed the possibility of need for interpositional graft of palmaris longus if  needed.  Should we be unable to adequately repair FDP, we could consider tenodesis of the FDP as well to prevent ongoing hyperextension.  Risks and benefits of the procedure were discussed, risks including but not limited to infection, bleeding, scarring, stiffness, nerve injury, tendon injury, vascular injury, recurrence of symptoms and need for subsequent operation.  We also discussed the significance of the postoperative protocol and the need for extensive occupational therapy afterward in order for her to have a positive outcome.  Patient expressed understanding.  Will move forward with surgical scheduling at the next available date urgently.     Follow-up: No follow-ups on file.   Meds & Orders: No orders of the defined types were placed in this encounter.   Orders Placed This Encounter  Procedures   XR Hand Complete Right     Procedures: No procedures performed      Clinical History: No specialty comments available.  She reports that she has been smoking cigarettes. She has never used smokeless tobacco.  Recent Labs    06/08/22 0957  HGBA1C 5.7*    Objective:   Vital Signs: LMP 01/21/2023   Physical Exam  Gen: Well-appearing, in no acute distress; non-toxic CV: Regular Rate. Well-perfused. Warm.  Resp: Breathing unlabored on room air; no wheezing. Psych: Fluid speech in conversation; appropriate affect; normal thought process  Ortho Exam Right hand: Small finger - Well-healed laceration over the volar aspect of the small finger, measures approximately 2 cm in nature, skin edges are well-approximated - Able to perform flexion at the PIP actively, unable to perform flexion at the DIP when isolated - Sensation is intact to the radial and ulnar border of the small finger over the P2 region to light touch, small area of numbness is notable over the volar pulp of the small finger to light touch - palmaris longus present   Imaging: XR Hand Complete Right Result Date:  03/05/2023 X-rays of the right hand, multiple views were obtained today X-rays demonstrate stable appearance of the radiocarpal and midcarpal articulations no significant abnormalities.  Small joints are well located in all planes throughout the digits as well.  Bone mineralization is appropriate.   Past Medical/Family/Surgical/Social History: Medications & Allergies reviewed per EMR, new medications updated. Patient Active Problem List   Diagnosis Date Noted   BV (bacterial vaginosis) 02/22/2023   Laceration of right little finger 02/22/2023   Decreased range of motion of finger of right hand 02/22/2023   Inability to conceive, female 01/03/2023   Cyst of right ovary 01/03/2023   Pelvic pain 12/14/2022   Vaginal discharge 11/19/2022   Routine screening for STI (sexually transmitted infection) 09/13/2022   Dyspareunia in female 06/19/2022   Encounter for routine adult physical exam with abnormal findings 06/08/2022   Dizziness 06/08/2022   Severe episode of recurrent major depressive disorder, without psychotic features (HCC)    MDD (major depressive disorder), recurrent episode, severe (HCC) 12/05/2014   Insomnia 03/09/2014   Loss of weight 03/09/2014   Depression 03/09/2014   Neuropathy 03/05/2012   Past Medical History:  Diagnosis  Date   Depression    Nasal congestion 12/04/2012   Tonsillar and adenoid hypertrophy 11/2012   snores during sleep, mother denies apnea   Family History  Problem Relation Age of Onset   Hypertension Mother    Heart disease Maternal Grandfather    Colon cancer Neg Hx    Past Surgical History:  Procedure Laterality Date   COLONOSCOPY N/A 04/13/2014   BX NL-ILEUM, COLON, RECTUM   TONSILLECTOMY     TONSILLECTOMY AND ADENOIDECTOMY Bilateral 12/09/2012   Procedure: BILATERAL TONSILLECTOMY AND ADENOIDECTOMY;  Surgeon: Ana LELON Moccasin, MD;  Location: [facility];  Service: ENT;  Laterality: Bilateral;   WISDOM TOOTH EXTRACTION     Social  History   Occupational History   Not on file  Tobacco Use   Smoking status: Every Day    Current packs/day: 0.50    Types: Cigarettes   Smokeless tobacco: Never  Vaping Use   Vaping status: Never Used  Substance and Sexual Activity   Alcohol use: Not Currently    Comment: every now and then   Drug use: Yes    Frequency: 1.0 times per week    Types: Marijuana    Comment: last used a few days ago as of 01/24/16   Sexual activity: Yes    Birth control/protection: None    Emilija Bohman Estela) Arlinda, M.D. [facility]

## 2023-03-07 ENCOUNTER — Other Ambulatory Visit: Payer: Self-pay | Admitting: Orthopedic Surgery

## 2023-03-07 ENCOUNTER — Encounter: Payer: Self-pay | Admitting: Orthopedic Surgery

## 2023-03-07 DIAGNOSIS — S66196A Other injury of flexor muscle, fascia and tendon of right little finger at wrist and hand level, initial encounter: Secondary | ICD-10-CM

## 2023-03-07 NOTE — Patient Instructions (Addendum)
        Great to see you today.  I have refilled the medication(s) we provide.    Please call Dermatology # [phone]    If labs were collected, we will inform you of lab results once received either by echart message or telephone call.   - echart message- for normal results that have been seen by the patient already.   - telephone call: abnormal results or if patient has not viewed results in their echart.   - Please take medications as prescribed. - Follow up with your primary health provider if any health concerns arises. - If symptoms worsen please contact your primary care provider and/or visit the emergency department.

## 2023-03-07 NOTE — Progress Notes (Signed)
Established Patient Office Visit   Subjective  Patient ID: Isabel Blackwell, female    DOB: 10/23/1995  Age: 28 y.o. MRN: [id number]  Chief Complaint  Patient presents with   Follow-up    PCP f/u for BV  Request New swab  Mole on lft hip with pain an itching that keeps coming back after falling off.     She  has a past medical history of Depression, Nasal congestion (12/04/2012), and Tonsillar and adenoid hypertrophy (11/2012).  HPI Patient presents to the clinic for BV follow up. For the details of today's visit, please refer to assessment and plan.   Review of Systems  Constitutional:  Negative for chills and fever.  Eyes:  Negative for blurred vision.  Respiratory:  Negative for shortness of breath.   Cardiovascular:  Negative for chest pain.  Musculoskeletal:  Positive for myalgias.  Neurological:  Negative for dizziness and headaches.      Objective:     BP 112/78   Pulse 77   Ht 5\' 3"  (1.6 m)   Wt 99 lb 1.9 oz (45 kg)   LMP 02/17/2023 (Approximate)   SpO2 96%   BMI 17.56 kg/m  BP Readings from Last 3 Encounters:  03/08/23 112/78  02/28/23 118/84  02/22/23 120/82      Physical Exam Vitals reviewed.  Constitutional:      General: She is not in acute distress.    Appearance: Normal appearance. She is not ill-appearing, toxic-appearing or diaphoretic.  HENT:     Head: Normocephalic.  Eyes:     General:        Right eye: No discharge.        Left eye: No discharge.     Conjunctiva/sclera: Conjunctivae normal.  Cardiovascular:     Rate and Rhythm: Normal rate.     Pulses: Normal pulses.     Heart sounds: Normal heart sounds.  Pulmonary:     Effort: Pulmonary effort is normal. No respiratory distress.     Breath sounds: Normal breath sounds.  Musculoskeletal:     Right hand: Tenderness present. Decreased range of motion. Decreased strength.     Left hand: No tenderness. Normal range of motion. Normal strength.  Skin:    General: Skin is warm and  dry.     Capillary Refill: Capillary refill takes less than 2 seconds.  Neurological:     Mental Status: She is alert.  Psychiatric:        Mood and Affect: Mood normal.        Behavior: Behavior normal.      No results found for any visits on 03/08/23.  The ASCVD Risk score (Arnett DK, et al., 2019) failed to calculate for the following reasons:   The 2019 ASCVD risk score is only valid for ages 1 to 52    Assessment & Plan:  Vaginal discharge -     NuSwab Vaginitis Plus (VG+) -     NuSwab Vaginitis Plus (VG+)  Skin mole -     Ambulatory referral to Dermatology  BV (bacterial vaginosis) Assessment & Plan: Nuswab ordered Discussed to maintain a balanced vaginal pH by avoiding douching and using only mild, unscented soaps around the vaginal area. Wearing breathable, cotton underwear and avoiding prolonged use of tight clothing can also help reduce moisture and bacteria buildup. Additionally, consider eating a diet rich in probiotics, such as yogurt or supplements, to support a healthy balance of vaginal bacteria.    Laceration of right little  finger without foreign body without damage to nail, subsequent encounter Assessment & Plan: Trial on Gabapentin 100 mg PRN for pain management  Discussed rest the finger and avoid activities that may exacerbate the discomfort. Apply ice for 15-20 minutes every few hours to reduce swelling Follow up with Orthopedics for further management    Other orders -     Fluconazole; Take 1 tablet (150 mg total) by mouth once for 1 dose.  Dispense: 1 tablet; Refill: 0 -     Gabapentin; Take 1 capsule (100 mg total) by mouth every 12 (twelve) hours as needed.  Dispense: 60 capsule; Refill: 2    Return if symptoms worsen or fail to improve.   Cruzita Lederer Newman Nip, FNP

## 2023-03-07 NOTE — Progress Notes (Signed)
HAND SURGERY UPDATE NOTE:  Patient was seen and evaluated in the preoperative area today at the outpatient surgery center for the previously discussed right small finger flexor digitorum profundus repair.  We once again discussed the chronicity of the injury being greater than 87-month-old and the likelihood for tendon retraction and scarring which would make primary repair of the FDP very challenging or potentially preclude our ability to do so.  We discussed the possibility of palmaris longus autograft as an interposition or potentially an FDP tenodesis as well from a surgical standpoint.  She was once again examined and noted to have adequate flexion at the PIP of the small finger indicating intact FDS tendon, making this an isolated FDP injury of the small finger and the likely zone 1 injury.  From a risk-benefit standpoint, we did discuss the high likelihood for increased stiffness and potentially impediment to excursion of the FDS tendon with attempted FDP repair, which could negatively impact her function moving forward.  Understanding the above, decision was made to cancel surgery for isolated FDP repair of the small finger and instead initiate therapeutic protocol to enhance and improve range of motion of the small finger with the use of the FDS tendon and PIP flexion.  Patient expressed understanding.  We will have her return to the office next week for further discussion and we will arrange for occupational therapy.  Isabel Cota, MD Hand Surgery, [facility]

## 2023-03-08 ENCOUNTER — Ambulatory Visit: Payer: Medicaid Other | Admitting: Family Medicine

## 2023-03-08 VITALS — BP 112/78 | HR 77 | Ht 63.0 in | Wt 99.1 lb

## 2023-03-08 DIAGNOSIS — D229 Melanocytic nevi, unspecified: Secondary | ICD-10-CM | POA: Diagnosis not present

## 2023-03-08 DIAGNOSIS — N760 Acute vaginitis: Secondary | ICD-10-CM | POA: Diagnosis not present

## 2023-03-08 DIAGNOSIS — N898 Other specified noninflammatory disorders of vagina: Secondary | ICD-10-CM | POA: Diagnosis not present

## 2023-03-08 DIAGNOSIS — B9689 Other specified bacterial agents as the cause of diseases classified elsewhere: Secondary | ICD-10-CM | POA: Diagnosis not present

## 2023-03-08 DIAGNOSIS — S61216D Laceration without foreign body of right little finger without damage to nail, subsequent encounter: Secondary | ICD-10-CM | POA: Diagnosis not present

## 2023-03-08 MED ORDER — GABAPENTIN 100 MG PO CAPS: 100.0000 mg | ORAL_CAPSULE | Freq: Two times a day (BID) | ORAL | 2 refills | Status: DC | PRN

## 2023-03-08 MED ORDER — FLUCONAZOLE 150 MG PO TABS: 150.0000 mg | ORAL_TABLET | Freq: Once | ORAL | 0 refills | Status: AC

## 2023-03-08 NOTE — Assessment & Plan Note (Signed)
Nuswab ordered Discussed to maintain a balanced vaginal pH by avoiding douching and using only mild, unscented soaps around the vaginal area. Wearing breathable, cotton underwear and avoiding prolonged use of tight clothing can also help reduce moisture and bacteria buildup. Additionally, consider eating a diet rich in probiotics, such as yogurt or supplements, to support a healthy balance of vaginal bacteria.

## 2023-03-08 NOTE — Assessment & Plan Note (Addendum)
Trial on Gabapentin 100 mg PRN for pain management  Discussed rest the finger and avoid activities that may exacerbate the discomfort. Apply ice for 15-20 minutes every few hours to reduce swelling Follow up with Orthopedics for further management

## 2023-03-12 ENCOUNTER — Other Ambulatory Visit: Payer: Self-pay | Admitting: Family Medicine

## 2023-03-12 ENCOUNTER — Encounter: Payer: Self-pay | Admitting: Family Medicine

## 2023-03-12 LAB — NUSWAB VAGINITIS PLUS (VG+)
Candida albicans, NAA: POSITIVE — AB
Candida glabrata, NAA: NEGATIVE

## 2023-03-12 MED ORDER — METRONIDAZOLE 500 MG PO TABS: 500.0000 mg | ORAL_TABLET | Freq: Two times a day (BID) | ORAL | 0 refills | Status: AC

## 2023-03-13 ENCOUNTER — Other Ambulatory Visit: Payer: Self-pay

## 2023-03-13 ENCOUNTER — Other Ambulatory Visit: Payer: Self-pay | Admitting: Family Medicine

## 2023-03-13 MED ORDER — FLUCONAZOLE 150 MG PO TABS: 150.0000 mg | ORAL_TABLET | Freq: Once | ORAL | 0 refills | Status: AC

## 2023-03-13 NOTE — Telephone Encounter (Unsigned)
Copied from CRM [id number]. Topic: Clinical - Prescription Issue >> Mar 12, 2023  4:18 PM Isabel Blackwell wrote: Reason for CRM: Patient calling to see if the prescription can be called in today for infection, received the meds for yeast infection. Just need the one for the Trich vag infection before the pharmacy closes.

## 2023-03-13 NOTE — Telephone Encounter (Unsigned)
Copied from CRM [id number]. Topic: Clinical - Prescription Issue >> Mar 12, 2023  4:18 PM Shelah Lewandowsky wrote: Reason for CRM: Patient calling to see if the prescription can be called in today for infection, received the meds for yeast infection. Just need the one for the Trich vag infection before the pharmacy closes.

## 2023-03-15 ENCOUNTER — Encounter: Payer: Self-pay | Admitting: Occupational Therapy

## 2023-03-15 ENCOUNTER — Ambulatory Visit: Payer: Medicaid Other | Attending: Orthopedic Surgery | Admitting: Occupational Therapy

## 2023-03-15 ENCOUNTER — Other Ambulatory Visit: Payer: Self-pay

## 2023-03-15 DIAGNOSIS — S66196A Other injury of flexor muscle, fascia and tendon of right little finger at wrist and hand level, initial encounter: Secondary | ICD-10-CM | POA: Diagnosis not present

## 2023-03-15 DIAGNOSIS — R208 Other disturbances of skin sensation: Secondary | ICD-10-CM | POA: Insufficient documentation

## 2023-03-15 DIAGNOSIS — R29898 Other symptoms and signs involving the musculoskeletal system: Secondary | ICD-10-CM | POA: Insufficient documentation

## 2023-03-15 DIAGNOSIS — M6281 Muscle weakness (generalized): Secondary | ICD-10-CM | POA: Insufficient documentation

## 2023-03-15 NOTE — Therapy (Signed)
OUTPATIENT OCCUPATIONAL THERAPY ORTHO EVALUATION  Patient Name: Isabel Blackwell MRN: [id number] DOB:04-11-1995, 28 y.o., female Today's Date: 03/15/2023  PCP: Rica Records, FNP  REFERRING PROVIDER: Samuella Cota, MD   END OF SESSION:  OT End of Session - 03/15/23 1612     Visit Number 1    Number of Visits 13   (including eval), anticipate 7 visits per pt request for 1x per week d/t scheduling conflicts, may add addtional visits to 2x per week as pt's schedule allows   Date for OT Re-Evaluation 05/10/23    Authorization Type [employer] MEDICA[employer] BLUE, auth reqd    OT Start Time 1451    OT Stop Time 1600    OT Time Calculation (min) 69 min    Activity Tolerance Patient tolerated treatment well    Behavior During Therapy WFL for tasks assessed/performed             Past Medical History:  Diagnosis Date   Depression    Nasal congestion 12/04/2012   Tonsillar and adenoid hypertrophy 11/2012   snores during sleep, mother denies apnea   Past Surgical History:  Procedure Laterality Date   COLONOSCOPY N/A 04/13/2014   BX NL-ILEUM, COLON, RECTUM   TONSILLECTOMY     TONSILLECTOMY AND ADENOIDECTOMY Bilateral 12/09/2012   Procedure: BILATERAL TONSILLECTOMY AND ADENOIDECTOMY;  Surgeon: Darletta Moll, MD;  Location: [facility];  Service: ENT;  Laterality: Bilateral;   WISDOM TOOTH EXTRACTION     Patient Active Problem List   Diagnosis Date Noted   BV (bacterial vaginosis) 02/22/2023   Laceration of right little finger 02/22/2023   Decreased range of motion of finger of right hand 02/22/2023   Inability to conceive, female 01/03/2023   Cyst of right ovary 01/03/2023   Pelvic pain 12/14/2022   Vaginal discharge 11/19/2022   Routine screening for STI (sexually transmitted infection) 09/13/2022   Dyspareunia in female 06/19/2022   Encounter for routine adult physical exam with abnormal findings 06/08/2022   Dizziness 06/08/2022   Severe episode of  recurrent major depressive disorder, without psychotic features (HCC)    MDD (major depressive disorder), recurrent episode, severe (HCC) 12/05/2014   Insomnia 03/09/2014   Loss of weight 03/09/2014   Depression 03/09/2014   Neuropathy 03/05/2012    ONSET DATE: 03/07/2023 (referral date),  initial FDP tendon laceration with rupture *no repair* (approx. 02/05/23)  REFERRING DIAG: C12.751Z (ICD-10-CM) - Other injury of flexor muscle, fascia and tendon of right little finger at wrist and hand level, initial encounter   THERAPY DIAG:  Muscle weakness (generalized)  Other symptoms and signs involving the musculoskeletal system  Other disturbances of skin sensation  Rationale for Evaluation and Treatment: Rehabilitation  SUBJECTIVE:   SUBJECTIVE STATEMENT: Pt reported grabbing a knife incorrectly which led to initial injury to R fingers/hand.   Pt reported difficulty with turning/twisting items such as to open jars/containers. Pt reported pain at anterior R hand on ulnar side and center of palm, radiating down to anterior wrist.  Pt reported difficulty with gripping items with affected hand.  Pt reported pain when pushing up/weightbearing through affected hand.  Pt accompanied by: self  PERTINENT HISTORY: hx of depression  Per 03/07/23 Referral Notes:  Dorsal blocking splint for small finger flexor tendon tear *no repair* Start on AROM Needs to be seen between 1/20-1/24  Per 03/07/23 MD Progress notes: "noted to have adequate flexion at the PIP of the small finger indicating intact FDS tendon, making this an  isolated FDP injury of the small finger and the likely zone 1 injury...decision was made to cancel surgery for isolated FDP repair of the small finger and instead initiate therapeutic protocol to enhance and improve range of motion of the small finger with the use of the FDS tendon and PIP flexion. "  Per 03/05/23 MD Progress Notes: right small finger laceration injury  sustained approximately 4 weeks prior. She was cooking when she cut herself with a kitchen knife over the volar aspect of the small finger at the level of the PIP. She was seen in the emergency department setting the day of injury and underwent washout and closure of the wound at that time. Her follow-up was quite delayed... Since the time of her injury, she has not been able to flex at the DIP joint of the small finger... her injury is consistent with a FDP tendon laceration with rupture."  PRECAUTIONS: Per MD: No precautions, maximize motion and function  RED FLAGS: FDP tendon laceration with rupture, no repair  WEIGHT BEARING RESTRICTIONS: No  PAIN:  Are you having pain? Yes: NPRS scale: 3/10 at rest currently, 8/10 in mornings, at worst over past week: 10/10 after moving a certain way Pain location: pain at anterior hand on ulnar side and center of palm, radiating down to anterior wrist Pain description: sharp Aggravating factors: moving a certain way though pt unsure which way Relieving factors: rest  FALLS: Has patient fallen in last 6 months? No  LIVING ENVIRONMENT: Lives with: lives with their family Lives in: House/apartment Stairs: No Has following equipment at home: None  PLOF: ind with ADLs, Occupation: Geologist, engineering (primarily housework e.g. sweeping), driving, attends classes (typing, handwriting)  PATIENT GOALS: "to be able to my grip back"  NEXT MD VISIT: Not yet scheduled, OT recommended to pt to call MD office to schedule f/u appointment, pt verbalized understanding.  OBJECTIVE:  Note: Objective measures were completed at Evaluation unless otherwise noted.  HAND DOMINANCE: Right  ADLs: Eating: ind Grooming: difficulty with putting hair in ponytail d/t difficulty with grip secondary to pain, mild difficulty with brushing teeth secondary to pain Upper body dressing: ind Lower body dressing: ind Toileting: ind Bathing: ind, painful at affected hand when  reaching across body to wash L side  Driving - painful when turning wheel with R hand, uses other hand to turn wheel  Opening car door with R hand - limited, uses only L hand  Work tasks (e.g. sweeping) - no limitations, mild pain  Jars/containers - Pt reported difficulty with turning/twisting items such as to open jars/containers.   Holding phone - some pain at affected wrist when holding phone  Meal prep - no difficulty  Typing - Pt avoided using R pinky finger and maintained digit 5 in ext and ABD  FUNCTIONAL OUTCOME MEASURES: Quick Dash: 36.4% deficit    UPPER EXTREMITY ROM:     Active ROM Right eval Left eval  Shoulder flexion [facility]  Shoulder abduction [facility]  Shoulder adduction    Shoulder extension    Shoulder internal rotation    Shoulder external rotation    Elbow flexion [facility] WFL  Elbow extension [facility]  Wrist flexion [facility]  Wrist extension [facility] WFL  Wrist ulnar deviation [facility] WFL  Wrist radial deviation [facility] WFL  Wrist pronation [facility] WFL  Wrist supination WFL WFL  (Blank rows = not tested)  Active ROM Right eval Left eval  Thumb MCP (0-60)  [facility]    WFL                             Thumb IP (0-80)    Thumb Radial abd/add (0-55)    Thumb Palmar abd/add (0-45)    Thumb Opposition to Small Finger    Index MCP (0-90)    Index PIP (0-100)    Index DIP (0-70)    Long MCP (0-90)    Long PIP (0-100)    Long DIP (0-70)    Ring MCP (0-90)    Ring PIP (0-100)    Ring DIP (0-70)    Little MCP (0-90) WFL    Little PIP (0-100) 60* AROM (85* AAROM, pt reported slight pain)   Little DIP (0-70)  0*   (Blank rows = not tested)  WFL - composite digit flex RUE digits 2-4 though pt reported tightness with full DIP flexion of digits 2-4.   Limitations of digit 5 during composite flexion secondary to FDP tendon laceration dx.  Wrist Prayer stretch and reverse prayer stretch - WFL, pt denied pain     HAND FUNCTION: Grip  strength: Right: 19, 16, 16 (17 lbs average)  lbs; Left: 42, 35, 40 lbs (39 lbs average)  Pt reported 5/10 pain with R hand grip strength testing. Pt maintained R pinky in ext and ABD secondary to digit 5 AROM deficits and partially d/t concerns about pain.  COORDINATION: 9 Hole Peg test: Right: 27 sec; Left: 25 sec  On R hand, pt maintained digit 5 in full ext at MCP and PIP.  Pt reported difficulty with dropping objects and holding onto objects: "everything slides out." Pt reported unable to hold change (coins) in affected hand.  SENSATION: Pt reported tingling of R hand when trying to open jars/containers or wringing out towel. Pt reported tingling symptoms resolve after a few minutes of stopping activity.  Pt reported numbness of R digit 5 distal fingertip.  EDEMA: none noted  COGNITION: Overall cognitive status: Within functional limits for tasks assessed  OBSERVATIONS: Pt ambulated ind. Pt was pleasant. Pt intermittently maintained R hand in protected position.  Pt had fully healed scars at center of anterior proximal phalanx and middle phalanx of ring finger and small finger of R hand and fully healed scar at center of anterior palm, slightly on ulnar side. Pt reported these scars were locations of initial injury to R hand. At these sites, pt and OT noted slightly darker discoloration of skin around scars. With min to mod pressure palpation over sites of scars, pt reported bruise-like pain.  Pt reported worst pain of affected hand occurs when opening jars, twisting objects, wringing towels, and pushing up to standing position by putting weight through R hand.  Pt reported keeping R hand in protected positioning "because I don't want to bump it [small finger]."  Pt sometimes seemed hesitant to participate in tasks d/t anticipating pain. However, with verbal encouragement, pt participated in different functional AROM movements of affected hand with no significant increases in pain  today.   TREATMENT DATE:  No charge d/t insurance type  Orthotics OT fabricated and fitted dorsal blocking splint to R digit 5 DIP. Pt reported splint was comfortable.  OT educated pt on methods to don/doff splint and splint wear schedule. Pt verbalized understanding of splint wear schedule. Pt returned demonstration to don/doff splint. OT educated pt to closely monitor skin integrity of R digit 5 to prevent restriction of blood flow when donning splint. OT educated on importance of avoiding redness, blue-tinge to skin, and cold temperature at distal fingertip, which are indicative of restricted blood flow. Pt verbalized understanding of all and adjusted fit of splint straps PRN.  TherAct OT educated pt on dx, UE anatomy, functional use of affected hand and impact d/t FDP tendon laceration. Pt verbalized understanding of all.   PATIENT EDUCATION: Education details: OT role, POC, see today's tx above Person educated: Patient Education method: Explanation Education comprehension: verbalized understanding  HOME EXERCISE PROGRAM: 03/15/23 - Splint wear schedule: Wear DIP digit 5 dorsal blocking splint during functional tasks. Try to use R hand as "normally" as possible while wearing splint by involving small finger in daily tasks. Avoid pain.  GOALS: Goals reviewed with patient? Yes  SHORT TERM GOALS: Target date: 04/12/23  Pt will be ind with initial RUE HEP using visual handouts. Baseline: new to outpt OT Goal status: INITIAL  2.  Pt will report decrease in pain during functional tasks to no more than 5/10 pain on average. Baseline: 3/10 at rest currently, 8/10 in mornings, at worst over past week: 10/10 after moving a certain way Goal status: INITIAL  3.  Pt will recall at least 2 pain management strategies. Baseline: new to outpt OT Goal status:  INITIAL  4.  Pt will recall at least 3 joint protection strategies.  Baseline: new to outpt OT Goal status: INITIAL  LONG TERM GOALS: Target date: 05/10/23  Pt will be ind with updated RUE HEP using visual handouts. Baseline: new to outpt OT Goal status: INITIAL  2.  Patient will demonstrate at least 16% improvement with quick Dash score (reporting 20.4% disability or less) indicating improved functional use of affected extremity.  Baseline: Quick Dash: 36.4% deficit Goal status: INITIAL  3.  Pt will demo understanding of compensatory strategies, adaptive strategies, and A/E to improve ind for ADL/IADL tasks and decrease pain of affected UE. Baseline: Pt reported difficulty with and pain with putting hair in ponytail, gripping steering wheel, opening car door, holding phone, opening jars/containers, and wringing towel  Goal status: INITIAL  ASSESSMENT:  CLINICAL IMPRESSION: Patient is a 28 y.o. female who was seen today for occupational therapy evaluation for Other injury of flexor muscle, fascia and tendon of right little finger at wrist and hand level. Patient currently presents below baseline level of functioning demonstrating functional deficits and impairments as noted below. Pt would benefit from skilled OT services in the outpatient setting to work on impairments as noted below to help pt return to PLOF as able, to improve understanding of adaptive strategies and A/E, and to increase ind and decrease pain during ADL/IADL.     PERFORMANCE DEFICITS: in functional skills including ADLs, IADLs, coordination, dexterity, proprioception, ROM, strength, pain, flexibility, Fine motor control, Gross motor control, body mechanics, endurance, and UE functional use, cognitive skills including  none , and psychosocial skills including environmental adaptation.   IMPAIRMENTS: are limiting patient from ADLs, IADLs, education, work, leisure, and social participation.   COMORBIDITIES: has no other  co-morbidities that affects occupational performance. Patient will benefit from  skilled OT to address above impairments and improve overall function.  MODIFICATION OR ASSISTANCE TO COMPLETE EVALUATION: No modification of tasks or assist necessary to complete an evaluation.  OT OCCUPATIONAL PROFILE AND HISTORY: Problem focused assessment: Including review of records relating to presenting problem.  CLINICAL DECISION MAKING: LOW - limited treatment options, no task modification necessary  REHAB POTENTIAL: Good  EVALUATION COMPLEXITY: Low      PLAN:  OT FREQUENCY: 1-2x/week (1x per week per pt request d/t class schedule)  OT DURATION: 6 weeks (dates extended to allow for scheduling)  PLANNED INTERVENTIONS: 97168 OT Re-evaluation, 97535 self care/ADL training, 40102 therapeutic exercise, 97530 therapeutic activity, 97112 neuromuscular re-education, 97140 manual therapy, 97035 ultrasound, 97018 paraffin, 72536 fluidotherapy, 97760 Orthotics management and training, 64403 Splinting (initial encounter), [phone] Subsequent splinting/medication, scar mobilization, passive range of motion, functional mobility training, energy conservation, patient/family education, and DME and/or AE instructions  RECOMMENDED OTHER SERVICES: n/a  CONSULTED AND AGREED WITH PLAN OF CARE: Patient  PLAN FOR NEXT SESSION:   HEP - ROM (tendon glides, finger opposition) and theraputty (yellow)  Handout - pain management (ice and heat modalities)  Handout - joint protection  A/E and adaptive strategies for ADL/IADL (see LTG #3 Baseline for list of ADL/IADL tasks)     For all possible CPT codes, reference the Planned Interventions line above.     Check all conditions that are expected to impact treatment: {Conditions expected to impact treatment:Musculoskeletal disorders   If treatment provided at initial evaluation, no treatment charged due to lack of authorization.        Wynetta Emery, OT 03/15/2023,  4:49 PM

## 2023-03-15 NOTE — Addendum Note (Signed)
Addended by: Wynetta Emery on: 03/15/2023 04:52 PM   Modules accepted: Orders

## 2023-03-21 ENCOUNTER — Encounter: Payer: Medicaid Other | Admitting: Orthopedic Surgery

## 2023-03-22 ENCOUNTER — Telehealth: Payer: Self-pay | Admitting: Occupational Therapy

## 2023-03-22 ENCOUNTER — Ambulatory Visit: Payer: Medicaid Other | Admitting: Occupational Therapy

## 2023-03-22 NOTE — Telephone Encounter (Signed)
Pt no-showed for OT appointment today. Therefore, OT called pt's listed mobile phone number [phone]). Pt reported accidentally forgetting about today's appointment. OT educated pt on clinic no-show/cancel policy and reminded pt of next OT appointment date/time. Pt verbalized understanding and reported intent to come to next OT appointment.

## 2023-03-29 ENCOUNTER — Ambulatory Visit: Payer: Medicaid Other | Attending: Orthopedic Surgery | Admitting: Occupational Therapy

## 2023-03-29 ENCOUNTER — Telehealth: Payer: Self-pay | Admitting: Occupational Therapy

## 2023-03-29 DIAGNOSIS — R29898 Other symptoms and signs involving the musculoskeletal system: Secondary | ICD-10-CM | POA: Insufficient documentation

## 2023-03-29 DIAGNOSIS — M6281 Muscle weakness (generalized): Secondary | ICD-10-CM | POA: Insufficient documentation

## 2023-03-29 DIAGNOSIS — R208 Other disturbances of skin sensation: Secondary | ICD-10-CM | POA: Insufficient documentation

## 2023-03-29 NOTE — Telephone Encounter (Signed)
 Pt no-showed for OT appointment today, which is second no-show. Therefore, OT called pt's listed mobile phone number [phone]). OT left voicemail reminding pt of next OT appointment date/time. OT recommended to pt to call clinic office if pt is unable to make appointments. OT provided contact information of clinic.

## 2023-03-30 DIAGNOSIS — R300 Dysuria: Secondary | ICD-10-CM | POA: Diagnosis not present

## 2023-03-30 DIAGNOSIS — Z681 Body mass index (BMI) 19 or less, adult: Secondary | ICD-10-CM | POA: Diagnosis not present

## 2023-04-05 ENCOUNTER — Ambulatory Visit: Payer: Medicaid Other | Admitting: Obstetrics & Gynecology

## 2023-04-05 ENCOUNTER — Encounter: Payer: Self-pay | Admitting: Obstetrics & Gynecology

## 2023-04-05 ENCOUNTER — Other Ambulatory Visit: Payer: Self-pay | Admitting: Advanced Practice Midwife

## 2023-04-05 ENCOUNTER — Ambulatory Visit: Payer: Medicaid Other

## 2023-04-05 VITALS — BP 111/73 | HR 82 | Ht 62.0 in | Wt 98.0 lb

## 2023-04-05 DIAGNOSIS — N83201 Unspecified ovarian cyst, right side: Secondary | ICD-10-CM

## 2023-04-05 DIAGNOSIS — Z8619 Personal history of other infectious and parasitic diseases: Secondary | ICD-10-CM

## 2023-04-05 DIAGNOSIS — R102 Pelvic and perineal pain: Secondary | ICD-10-CM

## 2023-04-05 DIAGNOSIS — Z3169 Encounter for other general counseling and advice on procreation: Secondary | ICD-10-CM

## 2023-04-05 DIAGNOSIS — N941 Unspecified dyspareunia: Secondary | ICD-10-CM

## 2023-04-05 DIAGNOSIS — N979 Female infertility, unspecified: Secondary | ICD-10-CM

## 2023-04-05 NOTE — Progress Notes (Signed)
PELVIC US TA/TV: homogeneous anteverted uterus,WNL,EEC 7.1 mm,normal left ovary,hemorrhagic vascular hypoechoic right ovarian cyst (? Corpus luteal cyst)1.9 x 1.4 x 1.6 cm,does not appear to be in same location as prior cyst,no free fluid,no pain  Chaperone Tish

## 2023-04-05 NOTE — Progress Notes (Signed)
   GYN VISIT Patient name: Isabel Blackwell MRN [id number]  Date of birth: 1995-09-25 Chief Complaint:   Follow-up  History of Present Illness:   Isabel Blackwell is a 28 y.o. G0P0000  female being seen today for the following concerns:  Pt had [place] today- initially thought this was due to dyspareunia back in March 2024.  Pt notes that she thinks it was done to recurrent BV.  Today she reports that she is no longer having any of these issues.  Denies pelvic or abdominal pain.  Denies dyspareunia.  Recent vaginitis panel x 3 has been negative.  Denies vaginal discharge, itching or irritation.    Upon further questioning, she is concerned about infertility.  Notes that she has been with the same partner for over 10 years.  While her partner has fathered a child x2.  She notes that she has never had a pregnancy.  -Menses are regular- tracking ovulation.  Denies HMB.  Some mild dysmenorrhea. She does note h/o Chlamydia   Recent pap completed with the [facility]- normal 2024  Patient's last menstrual period was 02/17/2023 (approximate).    Review of Systems:   Pertinent items are noted in HPI Denies fever/chills, dizziness, headaches, visual disturbances, fatigue, shortness of breath, chest pain, abdominal pain, vomiting, no problems with periods, bowel movements, urination, or intercourse unless otherwise stated above.  Pertinent History Reviewed:   Past Surgical History:  Procedure Laterality Date   COLONOSCOPY N/A 04/13/2014   BX NL-ILEUM, COLON, RECTUM   TONSILLECTOMY     TONSILLECTOMY AND ADENOIDECTOMY Bilateral 12/09/2012   Procedure: BILATERAL TONSILLECTOMY AND ADENOIDECTOMY;  Surgeon: Darletta Moll, MD;  Location: [facility];  Service: ENT;  Laterality: Bilateral;   WISDOM TOOTH EXTRACTION      Past Medical History:  Diagnosis Date   Depression    Nasal congestion 12/04/2012   Tonsillar and adenoid hypertrophy 11/2012   snores during sleep, mother denies  apnea   Reviewed problem list, medications and allergies. Physical Assessment:   Vitals:   04/05/23 0855  BP: 111/73  Pulse: 82  Weight: 98 lb (44.5 kg)  Height: 5\' 2"  (1.575 m)  Body mass index is 17.92 kg/m.       Physical Examination:   General appearance: alert, well appearing, and in no distress  Psych: mood appropriate, normal affect  Skin: warm & dry   Cardiovascular: normal heart rate noted  Respiratory: normal respiratory effort, no distress  Abdomen: soft, non-tender, no rebound, no guarding  Pelvic: examination not indicated  Extremities: no edema   Chaperone: N/A    [place]: homogeneous anteverted uterus,WNL,EEC 7.1 mm,normal left ovary,hemorrhagic vascular hypoechoic right ovarian cyst (? Corpus luteal cyst)1.9 x 1.4 x 1.6 cm,does not appear to be in same location as prior cyst,no free fluid,no pain   Assessment & Plan:  1) Pelvic pain -symptoms have now resolved -reviewed today's US- wnl.  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   Myna Hidalgo, DO Attending Obstetrician & Gynecologist, [facility] for [employer], [facility]

## 2023-04-11 ENCOUNTER — Telehealth: Payer: Self-pay | Admitting: Occupational Therapy

## 2023-04-11 NOTE — Telephone Encounter (Signed)
 OT called pt's listed phone number [phone]) to remind pt of upcoming appointment. OT left voicemail reminding pt of next OT appointment date/time, provided education on cancellation/no-show policy, recommended to pt to call clinic office if pt is unable to make appointments, and provided contact information of clinic.  OT to cancel all except 1 OT appointment if pt no-shows for next OT appointment.

## 2023-04-12 ENCOUNTER — Ambulatory Visit: Payer: Medicaid Other | Admitting: Occupational Therapy

## 2023-04-12 DIAGNOSIS — R208 Other disturbances of skin sensation: Secondary | ICD-10-CM

## 2023-04-12 DIAGNOSIS — R29898 Other symptoms and signs involving the musculoskeletal system: Secondary | ICD-10-CM | POA: Diagnosis not present

## 2023-04-12 DIAGNOSIS — M6281 Muscle weakness (generalized): Secondary | ICD-10-CM | POA: Diagnosis not present

## 2023-04-12 NOTE — Patient Instructions (Addendum)
 Marland Kitchen

## 2023-04-12 NOTE — Therapy (Signed)
 OUTPATIENT OCCUPATIONAL THERAPY ORTHO Treatment  Patient Name: Isabel Blackwell MRN: [id number] DOB:26-Oct-1995, 28 y.o., female Today's Date: 04/12/2023  PCP: Rica Records, FNP  REFERRING PROVIDER: Samuella Cota, MD   END OF SESSION:  OT End of Session - 04/12/23 1130     Visit Number 2    Number of Visits 13   (including eval), anticipate 7 visits per pt request for 1x per week d/t scheduling conflicts, may add addtional visits to 2x per week as pt's schedule allows   Date for OT Re-Evaluation 05/10/23    Authorization Type [place] [employer], auth reqd    OT Start Time 0845    OT Stop Time [phone]    OT Time Calculation (min) 39 min    Activity Tolerance Patient tolerated treatment well    Behavior During Therapy [facility] for tasks assessed/performed              Past Medical History:  Diagnosis Date   Depression    Nasal congestion 12/04/2012   Tonsillar and adenoid hypertrophy 11/2012   snores during sleep, mother denies apnea   Past Surgical History:  Procedure Laterality Date   COLONOSCOPY N/A 04/13/2014   BX NL-ILEUM, COLON, RECTUM   TONSILLECTOMY     TONSILLECTOMY AND ADENOIDECTOMY Bilateral 12/09/2012   Procedure: BILATERAL TONSILLECTOMY AND ADENOIDECTOMY;  Surgeon: Darletta Moll, MD;  Location: [facility];  Service: ENT;  Laterality: Bilateral;   WISDOM TOOTH EXTRACTION     Patient Active Problem List   Diagnosis Date Noted   BV (bacterial vaginosis) 02/22/2023   Laceration of right little finger 02/22/2023   Decreased range of motion of finger of right hand 02/22/2023   Inability to conceive, female 01/03/2023   Cyst of right ovary 01/03/2023   Pelvic pain 12/14/2022   Vaginal discharge 11/19/2022   Routine screening for STI (sexually transmitted infection) 09/13/2022   Dyspareunia in female 06/19/2022   Encounter for routine adult physical exam with abnormal findings 06/08/2022   Dizziness 06/08/2022   Severe episode of  recurrent major depressive disorder, without psychotic features (HCC)    MDD (major depressive disorder), recurrent episode, severe (HCC) 12/05/2014   Insomnia 03/09/2014   Loss of weight 03/09/2014   Depression 03/09/2014   Neuropathy 03/05/2012    ONSET DATE: 03/07/2023 (referral date),  initial FDP tendon laceration with rupture *no repair* (approx. 02/05/23)  REFERRING DIAG: J19.147W (ICD-10-CM) - Other injury of flexor muscle, fascia and tendon of right little finger at wrist and hand level, initial encounter   THERAPY DIAG:  Muscle weakness (generalized)  Other symptoms and signs involving the musculoskeletal system  Other disturbances of skin sensation  Rationale for Evaluation and Treatment: Rehabilitation  SUBJECTIVE:   SUBJECTIVE STATEMENT: Pt reported things going "okay," a little better. Pt reported feeling like the fabricated orthosis is helping.  Pt accompanied by: self  PERTINENT HISTORY: hx of depression  Per 03/07/23 Referral Notes:  Dorsal blocking splint for small finger flexor tendon tear *no repair* Start on AROM Needs to be seen between 1/20-1/24  Per 03/07/23 MD Progress notes: "noted to have adequate flexion at the PIP of the small finger indicating intact FDS tendon, making this an isolated FDP injury of the small finger and the likely zone 1 injury...decision was made to cancel surgery for isolated FDP repair of the small finger and instead initiate therapeutic protocol to enhance and improve range of motion of the small finger with the use of the FDS  tendon and PIP flexion. "  Per 03/05/23 MD Progress Notes: right small finger laceration injury sustained approximately 4 weeks prior. She was cooking when she cut herself with a kitchen knife over the volar aspect of the small finger at the level of the PIP. She was seen in the emergency department setting the day of injury and underwent washout and closure of the wound at that time. Her follow-up was quite  delayed... Since the time of her injury, she has not been able to flex at the DIP joint of the small finger... her injury is consistent with a FDP tendon laceration with rupture."  PRECAUTIONS: Per MD: No precautions, maximize motion and function  RED FLAGS: FDP tendon laceration with rupture, no repair  WEIGHT BEARING RESTRICTIONS: No  PAIN:  Are you having pain? Yes: NPRS scale: 1/10 at rest currently, 7/10 in morning at worst over past week Pain location: center of affected palm Pain description: dull aching Aggravating factors: repetitive gripping, twisting/turning Relieving factors: rest  FALLS: Has patient fallen in last 6 months? No  LIVING ENVIRONMENT: Lives with: lives with their family Lives in: House/apartment Stairs: No Has following equipment at home: None  PLOF: ind with ADLs, Occupation: Geologist, engineering (primarily housework e.g. sweeping), driving, attends classes (typing, handwriting)  PATIENT GOALS: "to be able to my grip back"  NEXT MD VISIT: Not yet scheduled, OT recommended to pt to call MD office to schedule f/u appointment, pt verbalized understanding.  OBJECTIVE:  Note: Objective measures were completed at Evaluation unless otherwise noted.  HAND DOMINANCE: Right  ADLs: Eating: ind Grooming: difficulty with putting hair in ponytail d/t difficulty with grip secondary to pain, mild difficulty with brushing teeth secondary to pain Upper body dressing: ind Lower body dressing: ind Toileting: ind Bathing: ind, painful at affected hand when reaching across body to wash L side  Driving - painful when turning wheel with R hand, uses other hand to turn wheel  Opening car door with R hand - limited, uses only L hand  Work tasks (e.g. sweeping) - no limitations, mild pain  Jars/containers - Pt reported difficulty with turning/twisting items such as to open jars/containers.   Holding phone - some pain at affected wrist when holding phone  Meal prep - no  difficulty  Typing - Pt avoided using R pinky finger and maintained digit 5 in ext and ABD  FUNCTIONAL OUTCOME MEASURES: Quick Dash: 36.4% deficit    UPPER EXTREMITY ROM:     Active ROM Right eval Left eval  Shoulder flexion [facility]  Shoulder abduction [facility]  Shoulder adduction    Shoulder extension    Shoulder internal rotation    Shoulder external rotation    Elbow flexion [facility] WFL  Elbow extension [facility]  Wrist flexion [facility]  Wrist extension [facility] WFL  Wrist ulnar deviation [facility] WFL  Wrist radial deviation [facility] WFL  Wrist pronation [facility] WFL  Wrist supination WFL WFL  (Blank rows = not tested)  Active ROM Right eval Left eval  Thumb MCP (0-60)             WFL    WFL                             Thumb IP (0-80)    Thumb Radial abd/add (0-55)    Thumb Palmar abd/add (0-45)    Thumb Opposition to Small Finger    Index MCP (0-90)  Index PIP (0-100)    Index DIP (0-70)    Long MCP (0-90)    Long PIP (0-100)    Long DIP (0-70)    Ring MCP (0-90)    Ring PIP (0-100)    Ring DIP (0-70)    Little MCP (0-90) WFL    Little PIP (0-100) 60* AROM (85* AAROM, pt reported slight pain)   Little DIP (0-70)  0*   (Blank rows = not tested)  WFL - composite digit flex RUE digits 2-4 though pt reported tightness with full DIP flexion of digits 2-4.   Limitations of digit 5 during composite flexion secondary to FDP tendon laceration dx.  Wrist Prayer stretch and reverse prayer stretch - WFL, pt denied pain     HAND FUNCTION: Grip strength: Right: 19, 16, 16 (17 lbs average)  lbs; Left: 42, 35, 40 lbs (39 lbs average)  Pt reported 5/10 pain with R hand grip strength testing. Pt maintained R pinky in ext and ABD secondary to digit 5 AROM deficits and partially d/t concerns about pain.  COORDINATION: 9 Hole Peg test: Right: 27 sec; Left: 25 sec  On R hand, pt maintained digit 5 in full ext at MCP and PIP.  Pt reported difficulty with dropping objects  and holding onto objects: "everything slides out." Pt reported unable to hold change (coins) in affected hand.  SENSATION: Pt reported tingling of R hand when trying to open jars/containers or wringing out towel. Pt reported tingling symptoms resolve after a few minutes of stopping activity.  Pt reported numbness of R digit 5 distal fingertip.  EDEMA: none noted  COGNITION: Overall cognitive status: Within functional limits for tasks assessed  OBSERVATIONS: Pt ambulated ind. Pt was pleasant. Pt intermittently maintained R hand in protected position.  Pt had fully healed scars at center of anterior proximal phalanx and middle phalanx of ring finger and small finger of R hand and fully healed scar at center of anterior palm, slightly on ulnar side. Pt reported these scars were locations of initial injury to R hand. At these sites, pt and OT noted slightly darker discoloration of skin around scars. With min to mod pressure palpation over sites of scars, pt reported bruise-like pain.  Pt reported worst pain of affected hand occurs when opening jars, twisting objects, wringing towels, and pushing up to standing position by putting weight through R hand.  Pt reported keeping R hand in protected positioning "because I don't want to bump it [small finger]."  Pt sometimes seemed hesitant to participate in tasks d/t anticipating pain. However, with verbal encouragement, pt participated in different functional AROM movements of affected hand with no significant increases in pain today.   TREATMENT DATE:                                                                                                                             Self-Care OT educated pt on late/no-show policy, UE anatomy, joint  protection. Pt acknowledged understanding of all.   Pt reported no concerns about current orthosis and politely declined additional Velcro for orthosis. Pt ind donned/doffed orthosis.  Pt reported pain of  affected hand in morning after waking up and reported sometimes sleeping with affected hand under head. Therefore, OT provided education to pt regarding Sleep positioning to protect affected UE and decrease pain of affected hand. Pt acknowledged understanding of all. Handout provided, see pt instructions.   OT educated pt on A/E options and adaptive strategies to open jars/containers - to decrease pain with functional tasks, to improve joint protection. OT showed examples online of electric can opener, electric jar opener, Dycem, silicone/rubber jar openers. OT provided Dycem to pt and pt returned demo of using Dycem. Pt acknowledged understanding of all education.  Neuro Re-Ed HEP update: tendon glides - to improve FM coordination and dexterity, to decrease pain, to improve proprioception of affected hand. Pt benefited from fading therapist modeling and v/c.   PATIENT EDUCATION: Education details: see today's tx above Person educated: Patient Education method: Explanation and Handouts Education comprehension: verbalized understanding, returned demonstration, and needs further education  HOME EXERCISE PROGRAM: 03/15/23 - Splint wear schedule: Wear DIP digit 5 dorsal blocking splint during functional tasks. Try to use R hand as "normally" as possible while wearing splint by involving small finger in daily tasks. Avoid pain. 04/12/23 - tendon glides, sleep positioning - see pt instructions  GOALS: Goals reviewed with patient? Yes  SHORT TERM GOALS: Target date: 04/12/23  Pt will be ind with initial RUE HEP using visual handouts. Baseline: new to outpt OT Goal status: INITIAL  2.  Pt will report decrease in pain during functional tasks to no more than 5/10 pain on average. Baseline: 3/10 at rest currently, 8/10 in mornings, at worst over past week: 10/10 after moving a certain way Goal status: INITIAL  3.  Pt will recall at least 2 pain management strategies. Baseline: new to outpt OT Goal  status: INITIAL  4.  Pt will recall at least 3 joint protection strategies.  Baseline: new to outpt OT Goal status: INITIAL  LONG TERM GOALS: Target date: 05/10/23  Pt will be ind with updated RUE HEP using visual handouts. Baseline: new to outpt OT Goal status: INITIAL  2.  Patient will demonstrate at least 16% improvement with quick Dash score (reporting 20.4% disability or less) indicating improved functional use of affected extremity.  Baseline: Quick Dash: 36.4% deficit Goal status: INITIAL  3.  Pt will demo understanding of compensatory strategies, adaptive strategies, and A/E to improve ind for ADL/IADL tasks and decrease pain of affected UE. Baseline: Pt reported difficulty with and pain with putting hair in ponytail, gripping steering wheel, opening car door, holding phone, opening jars/containers, and wringing towel  Goal status: INITIAL  ASSESSMENT:  CLINICAL IMPRESSION: Pt tolerated tasks well today. Pt would benefit from additional education regarding joint protection and adaptive strategies for functional tasks. Pt would benefit from skilled OT services in the outpatient setting to work on impairments as noted below to help pt return to PLOF as able, to improve understanding of adaptive strategies and A/E, and to increase ind and decrease pain during ADL/IADL.     PERFORMANCE DEFICITS: in functional skills including ADLs, IADLs, coordination, dexterity, proprioception, ROM, strength, pain, flexibility, Fine motor control, Gross motor control, body mechanics, endurance, and UE functional use, cognitive skills including  none , and psychosocial skills including environmental adaptation.   IMPAIRMENTS: are limiting patient from ADLs, IADLs,  education, work, leisure, and social participation.   COMORBIDITIES: has no other co-morbidities that affects occupational performance. Patient will benefit from skilled OT to address above impairments and improve overall  function.  MODIFICATION OR ASSISTANCE TO COMPLETE EVALUATION: No modification of tasks or assist necessary to complete an evaluation.  OT OCCUPATIONAL PROFILE AND HISTORY: Problem focused assessment: Including review of records relating to presenting problem.  CLINICAL DECISION MAKING: LOW - limited treatment options, no task modification necessary  REHAB POTENTIAL: Good  EVALUATION COMPLEXITY: Low      PLAN:  OT FREQUENCY: 1-2x/week (1x per week per pt request d/t class schedule)  OT DURATION: 6 weeks (dates extended to allow for scheduling)  PLANNED INTERVENTIONS: 97168 OT Re-evaluation, 97535 self care/ADL training, 10272 therapeutic exercise, 97530 therapeutic activity, 97112 neuromuscular re-education, 97140 manual therapy, 97035 ultrasound, 97018 paraffin, 53664 fluidotherapy, 97760 Orthotics management and training, 40347 Splinting (initial encounter), [phone] Subsequent splinting/medication, scar mobilization, passive range of motion, functional mobility training, energy conservation, patient/family education, and DME and/or AE instructions  RECOMMENDED OTHER SERVICES: n/a  CONSULTED AND AGREED WITH PLAN OF CARE: Patient  PLAN FOR NEXT SESSION:   Review tendon glides  HEP: finger opposition and theraputty (yellow)  Handout - pain management (ice and heat modalities)  Handout - joint protection  A/E and adaptive strategies for ADL/IADL (see LTG #3 Baseline for list of ADL/IADL tasks)     For all possible CPT codes, reference the Planned Interventions line above.     Check all conditions that are expected to impact treatment: {Conditions expected to impact treatment:Musculoskeletal disorders   If treatment provided at initial evaluation, no treatment charged due to lack of authorization.        Wynetta Emery, OT 04/12/2023, 11:46 AM

## 2023-04-17 ENCOUNTER — Ambulatory Visit (HOSPITAL_COMMUNITY)
Admission: RE | Admit: 2023-04-17 | Discharge: 2023-04-17 | Disposition: A | Discharge status: Home / Self Care | Payer: Medicaid Other | Source: Ambulatory Visit | Attending: Obstetrics & Gynecology | Admitting: Obstetrics & Gynecology

## 2023-04-17 DIAGNOSIS — Z3169 Encounter for other general counseling and advice on procreation: Secondary | ICD-10-CM | POA: Insufficient documentation

## 2023-04-17 DIAGNOSIS — N979 Female infertility, unspecified: Secondary | ICD-10-CM | POA: Diagnosis not present

## 2023-04-17 MED ORDER — POVIDONE-IODINE 10 % EX SOLN
CUTANEOUS | Status: AC
  Filled 2023-04-17: qty 29.6

## 2023-04-17 MED ORDER — IOHEXOL 180 MG/ML  SOLN
6.0000 mL | Freq: Once | INTRAMUSCULAR | Status: AC | PRN
  Administered 2023-04-17: 6 mL

## 2023-04-19 ENCOUNTER — Ambulatory Visit: Payer: Medicaid Other | Admitting: Occupational Therapy

## 2023-04-19 ENCOUNTER — Telehealth: Payer: Self-pay | Admitting: Occupational Therapy

## 2023-04-19 NOTE — Telephone Encounter (Signed)
 Pt no-showed for OT appointment today, which is third no-show overall. Therefore, OT called pt's listed phone number [phone]). Per pt, pt overslept today. OT provided option to reschedule OT session later today though pt politely declined. Following discussion with pt about preferred days/times, OT rescheduled pt's upcoming appointments. OT educated pt on use of MyChart to track appointments, clinic no-show/cancel policy, and potential for D/C per clinic policy if pt does not arrive for next appointment. Pt acknowledged understanding of all. Pt reported intent to come to next appointment.

## 2023-04-22 ENCOUNTER — Other Ambulatory Visit: Payer: Self-pay | Admitting: Obstetrics & Gynecology

## 2023-04-22 DIAGNOSIS — Z319 Encounter for procreative management, unspecified: Secondary | ICD-10-CM

## 2023-04-22 NOTE — Progress Notes (Signed)
-  Called patient with results of recent HSG, no evidence of tubal infertility -Encourage patient to actively trying for pregnancy over the next few months -Lab work ordered for day 3 []  further management pending results  Isabel Hidalgo, DO Attending Obstetrician & Gynecologist, Biochemist, clinical for [employer], [facility]

## 2023-04-25 ENCOUNTER — Telehealth: Payer: Self-pay | Admitting: Occupational Therapy

## 2023-04-25 NOTE — Telephone Encounter (Signed)
 OT called pt's listed phone number [phone]) and reminded pt of upcoming OT appointment date/time. Pt verbalized understanding and confirmed intent to come for OT appointment.

## 2023-04-26 ENCOUNTER — Ambulatory Visit: Payer: Medicaid Other | Attending: Orthopedic Surgery | Admitting: Occupational Therapy

## 2023-04-26 DIAGNOSIS — R208 Other disturbances of skin sensation: Secondary | ICD-10-CM | POA: Diagnosis not present

## 2023-04-26 DIAGNOSIS — M6281 Muscle weakness (generalized): Secondary | ICD-10-CM | POA: Diagnosis not present

## 2023-04-26 DIAGNOSIS — R29898 Other symptoms and signs involving the musculoskeletal system: Secondary | ICD-10-CM

## 2023-04-26 NOTE — Therapy (Addendum)
 OUTPATIENT OCCUPATIONAL THERAPY ORTHO Treatment  Patient Name: Isabel Blackwell MRN: [id number] DOB:[birth year], 28 y.o., female Today's Date: 04/26/2023  PCP: Rica Records, FNP  REFERRING PROVIDER: Samuella Cota, MD   END OF SESSION:  OT End of Session - 04/26/23 1111     Visit Number 3    Number of Visits 13   (including eval), anticipate 7 visits per pt request for 1x per week d/t scheduling conflicts, may add addtional visits to 2x per week as pt's schedule allows   Date for OT Re-Evaluation 05/10/23    Authorization Type [place] [employer]ICA[employer], auth reqd    OT Start Time 1018    OT Stop Time 1103    OT Time Calculation (min) 45 min    Activity Tolerance Patient tolerated treatment well    Behavior During Therapy WFL for tasks assessed/performed               Past Medical History:  Diagnosis Date   Depression    Nasal congestion 12/04/2012   Tonsillar and adenoid hypertrophy 11/2012   snores during sleep, mother denies apnea   Past Surgical History:  Procedure Laterality Date   COLONOSCOPY N/A 04/13/2014   BX NL-ILEUM, COLON, RECTUM   TONSILLECTOMY     TONSILLECTOMY AND ADENOIDECTOMY Bilateral 12/09/2012   Procedure: BILATERAL TONSILLECTOMY AND ADENOIDECTOMY;  Surgeon: Darletta Moll, MD;  Location: [facility];  Service: ENT;  Laterality: Bilateral;   WISDOM TOOTH EXTRACTION     Patient Active Problem List   Diagnosis Date Noted   BV (bacterial vaginosis) 02/22/2023   Laceration of right little finger 02/22/2023   Decreased range of motion of finger of right hand 02/22/2023   Inability to conceive, female 01/03/2023   Cyst of right ovary 01/03/2023   Pelvic pain 12/14/2022   Vaginal discharge 11/19/2022   Routine screening for STI (sexually transmitted infection) 09/13/2022   Dyspareunia in female 06/19/2022   Encounter for routine adult physical exam with abnormal findings 06/08/2022   Dizziness 06/08/2022   Severe episode of  recurrent major depressive disorder, without psychotic features (HCC)    MDD (major depressive disorder), recurrent episode, severe (HCC) 12/05/2014   Insomnia 03/09/2014   Loss of weight 03/09/2014   Depression 03/09/2014   Neuropathy 03/05/2012    ONSET DATE: 03/07/2023 (referral date),  initial FDP tendon laceration with rupture *no repair* (approx. 02/05/23)  REFERRING DIAG: W09.811B (ICD-10-CM) - Other injury of flexor muscle, fascia and tendon of right little finger at wrist and hand level, initial encounter   THERAPY DIAG:  Muscle weakness (generalized)  Other symptoms and signs involving the musculoskeletal system  Other disturbances of skin sensation  Rationale for Evaluation and Treatment: Rehabilitation  SUBJECTIVE:   SUBJECTIVE STATEMENT: Pt reported no updates. Pt reported increased pain when pt accidentally hit R hand against a basket when folding laundry. Pt reported completing tendon glides HEP.  Pt accompanied by: self  PERTINENT HISTORY: hx of depression  Per 03/07/23 Referral Notes:  Dorsal blocking splint for small finger flexor tendon tear *no repair* Start on AROM Needs to be seen between 1/20-1/24  Per 03/07/23 MD Progress notes: "noted to have adequate flexion at the PIP of the small finger indicating intact FDS tendon, making this an isolated FDP injury of the small finger and the likely zone 1 injury...decision was made to cancel surgery for isolated FDP repair of the small finger and instead initiate therapeutic protocol to enhance and improve range of motion  of the small finger with the use of the FDS tendon and PIP flexion. "  Per 03/05/23 MD Progress Notes: right small finger laceration injury sustained approximately 4 weeks prior. She was cooking when she cut herself with a kitchen knife over the volar aspect of the small finger at the level of the PIP. She was seen in the emergency department setting the day of injury and underwent washout and  closure of the wound at that time. Her follow-up was quite delayed... Since the time of her injury, she has not been able to flex at the DIP joint of the small finger... her injury is consistent with a FDP tendon laceration with rupture."  PRECAUTIONS: Per MD: No precautions, maximize motion and function  RED FLAGS: FDP tendon laceration with rupture, no repair  WEIGHT BEARING RESTRICTIONS: No  PAIN:  Are you having pain? Yes: NPRS scale: 1/10 at rest currently, 10/10 in morning at worst over past week after pt hit hand against an object Pain location: center of affected palm, slightly toward ulnar side Pain description: "it's okay" Aggravating factors: repetitive gripping, twisting/turning Relieving factors: rest  FALLS: Has patient fallen in last 6 months? No  LIVING ENVIRONMENT: Lives with: lives with their family Lives in: House/apartment Stairs: No Has following equipment at home: None  PLOF: ind with ADLs, Occupation: Geologist, engineering (primarily housework e.g. sweeping), driving, attends classes (typing, handwriting)  PATIENT GOALS: "to be able to my grip back"  NEXT MD VISIT: Not yet scheduled, OT recommended to pt to call MD office to schedule f/u appointment, pt verbalized understanding.  OBJECTIVE:  Note: Objective measures were completed at Evaluation unless otherwise noted.  HAND DOMINANCE: Right  ADLs: Eating: ind Grooming: difficulty with putting hair in ponytail d/t difficulty with grip secondary to pain, mild difficulty with brushing teeth secondary to pain Upper body dressing: ind Lower body dressing: ind Toileting: ind Bathing: ind, painful at affected hand when reaching across body to wash L side  Driving - painful when turning wheel with R hand, uses other hand to turn wheel  Opening car door with R hand - limited, uses only L hand  Work tasks (e.g. sweeping) - no limitations, mild pain  Jars/containers - Pt reported difficulty with turning/twisting  items such as to open jars/containers.   Holding phone - some pain at affected wrist when holding phone  Meal prep - no difficulty  Typing - Pt avoided using R pinky finger and maintained digit 5 in ext and ABD  FUNCTIONAL OUTCOME MEASURES: Quick Dash: 36.4% deficit    UPPER EXTREMITY ROM:     Active ROM Right eval Left eval  Shoulder flexion [facility]  Shoulder abduction [facility]  Shoulder adduction    Shoulder extension    Shoulder internal rotation    Shoulder external rotation    Elbow flexion [facility] WFL  Elbow extension [facility]  Wrist flexion [facility]  Wrist extension [facility] WFL  Wrist ulnar deviation [facility] WFL  Wrist radial deviation [facility] WFL  Wrist pronation [facility] WFL  Wrist supination WFL WFL  (Blank rows = not tested)  Active ROM Right eval Left eval  Thumb MCP (0-60)             WFL    WFL                             Thumb IP (0-80)    Thumb Radial abd/add (0-55)  Thumb Palmar abd/add (0-45)    Thumb Opposition to Small Finger    Index MCP (0-90)    Index PIP (0-100)    Index DIP (0-70)    Long MCP (0-90)    Long PIP (0-100)    Long DIP (0-70)    Ring MCP (0-90)    Ring PIP (0-100)    Ring DIP (0-70)    Little MCP (0-90) WFL    Little PIP (0-100) 60* AROM (85* AAROM, pt reported slight pain)   Little DIP (0-70)  0*   (Blank rows = not tested)  WFL - composite digit flex RUE digits 2-4 though pt reported tightness with full DIP flexion of digits 2-4.   Limitations of digit 5 during composite flexion secondary to FDP tendon laceration dx.  Wrist Prayer stretch and reverse prayer stretch - WFL, pt denied pain     HAND FUNCTION: Grip strength: Right: 19, 16, 16 (17 lbs average)  lbs; Left: 42, 35, 40 lbs (39 lbs average)  Pt reported 5/10 pain with R hand grip strength testing. Pt maintained R pinky in ext and ABD secondary to digit 5 AROM deficits and partially d/t concerns about pain.  COORDINATION: 9 Hole Peg test: Right: 27 sec;  Left: 25 sec  On R hand, pt maintained digit 5 in full ext at MCP and PIP.  Pt reported difficulty with dropping objects and holding onto objects: "everything slides out." Pt reported unable to hold change (coins) in affected hand.  SENSATION: Pt reported tingling of R hand when trying to open jars/containers or wringing out towel. Pt reported tingling symptoms resolve after a few minutes of stopping activity.  Pt reported numbness of R digit 5 distal fingertip.  EDEMA: none noted  COGNITION: Overall cognitive status: Within functional limits for tasks assessed  OBSERVATIONS: Pt ambulated ind. Pt was pleasant. Pt intermittently maintained R hand in protected position.  Pt had fully healed scars at center of anterior proximal phalanx and middle phalanx of ring finger and small finger of R hand and fully healed scar at center of anterior palm, slightly on ulnar side. Pt reported these scars were locations of initial injury to R hand. At these sites, pt and OT noted slightly darker discoloration of skin around scars. With min to mod pressure palpation over sites of scars, pt reported bruise-like pain.  Pt reported worst pain of affected hand occurs when opening jars, twisting objects, wringing towels, and pushing up to standing position by putting weight through R hand.  Pt reported keeping R hand in protected positioning "because I don't want to bump it [small finger]."  Pt sometimes seemed hesitant to participate in tasks d/t anticipating pain. However, with verbal encouragement, pt participated in different functional AROM movements of affected hand with no significant increases in pain today.   TREATMENT DATE:  Self-Care Review of HEP - tendon glides - to improve carryover, to improve AROM of affected UE. Pt returned demo with visual handouts.  OT noted mild skin  maceration of pt's R digit 5 after pt removed splint likely d/t moisture retention when wearing splint. Pt reported wearing splint very frequently d/t fear of pain. OT educated pt on skin integrity, signs of increased moisture retention, splint wear schedule and importance of monitoring skin integrity when wearing splint. OT recommended to pt to remove splint to allow air flow if pt notices signs of increased moisture retention. Pt acknowledged understanding of all.   OT educated pt on desensitization, UE anatomy, pain threshold, prognosis, dx. Pt verbalized understanding then returned demo desensitization strategy of gently tapping R hand and R digit 5 on tabletop for desensitization and sensory input. Pt reported no increase in pain/discomfort.  OT educated pt on clinic late/no-show policy, OT role, POC. Pt verbalized understanding.   TherAct HEP update: Theraputty (yellow) - to improve affected UE strength and FM coordination, to improve sensory tolerance, to decrease pain/discomfort during functional movements. Pt sometimes limited by pain though pt reported improved tolerance with subsequent reps. OT educated pt on pain threshold, adjustments to exercises to manage pain, and only completing reps as prescribed. Pt verbalized understanding of all.  Access Code: [email or web address] URL: [email or web address] Date: 04/26/2023 Prepared by: Carilyn Goodpasture  Exercises - Thumb Opposition  - 2 x daily - 1 sets - 10 reps - Rolling Putty on Table  - 2 x daily - 1 sets - 10 reps - Putty Squeezes  - 2 x daily - 1 sets - 10 reps - Finger Adduction with Putty  - 2 x daily - 1 sets - 10 reps - Flatten theraputty  - 2 x daily - 1 sets - 10 reps - Tip Pinch with Putty  - 2 x daily - 1 sets - 10 reps - 3-Point Pinch with Putty  - 2 x daily - 1 sets - 10 reps    PATIENT EDUCATION: Education details: see today's tx above Person educated: Patient Education method: Explanation and Handouts Education  comprehension: verbalized understanding, returned demonstration, and needs further education  HOME EXERCISE PROGRAM: 03/15/23 - Splint wear schedule: Wear DIP digit 5 dorsal blocking splint during functional tasks. Try to use R hand as "normally" as possible while wearing splint by involving small finger in daily tasks. Avoid pain. 04/12/23 - tendon glides, sleep positioning - see pt instructions 04/26/23 - yellow theraputty, Access Code: [email or web address]  GOALS: Goals reviewed with patient? Yes  SHORT TERM GOALS: Target date: 04/12/23  Pt will be ind with initial RUE HEP using visual handouts. Baseline: new to outpt OT Goal status: INITIAL  2.  Pt will report decrease in pain during functional tasks to no more than 5/10 pain on average. Baseline: 3/10 at rest currently, 8/10 in mornings, at worst over past week: 10/10 after moving a certain way Goal status: INITIAL  3.  Pt will recall at least 2 pain management strategies. Baseline: new to outpt OT Goal status: INITIAL  4.  Pt will recall at least 3 joint protection strategies.  Baseline: new to outpt OT Goal status: INITIAL  LONG TERM GOALS: Target date: 05/10/23  Pt will be ind with updated RUE HEP using visual handouts. Baseline: new to outpt OT Goal status: INITIAL  2.  Patient will demonstrate at least 16% improvement with quick Dash score (reporting 20.4% disability or less) indicating improved functional use of affected extremity.  Baseline: Quick Dash: 36.4% deficit Goal status: INITIAL  3.  Pt will demo understanding of compensatory strategies, adaptive strategies, and A/E to improve ind for ADL/IADL tasks and decrease pain of affected UE. Baseline: Pt reported difficulty with and pain with putting hair in ponytail, gripping steering wheel, opening car door, holding phone, opening jars/containers, and wringing towel  Goal status: INITIAL  ASSESSMENT:  CLINICAL IMPRESSION: Pt tolerated tasks well today, sometimes limited by  pain. Pt would benefit from skilled OT services in the outpatient setting to work on impairments as noted below to help pt return to PLOF as able, to improve understanding of adaptive strategies and A/E, and to increase ind and decrease pain during ADL/IADL.     PERFORMANCE DEFICITS: in functional skills including ADLs, IADLs, coordination, dexterity, proprioception, ROM, strength, pain, flexibility, Fine motor control, Gross motor control, body mechanics, endurance, and UE functional use, cognitive skills including  none , and psychosocial skills including environmental adaptation.   IMPAIRMENTS: are limiting patient from ADLs, IADLs, education, work, leisure, and social participation.   COMORBIDITIES: has no other co-morbidities that affects occupational performance. Patient will benefit from skilled OT to address above impairments and improve overall function.  MODIFICATION OR ASSISTANCE TO COMPLETE EVALUATION: No modification of tasks or assist necessary to complete an evaluation.  OT OCCUPATIONAL PROFILE AND HISTORY: Problem focused assessment: Including review of records relating to presenting problem.  CLINICAL DECISION MAKING: LOW - limited treatment options, no task modification necessary  REHAB POTENTIAL: Good  EVALUATION COMPLEXITY: Low      PLAN:  OT FREQUENCY: 1-2x/week (1x per week per pt request d/t class schedule)  OT DURATION: 6 weeks (dates extended to allow for scheduling)  PLANNED INTERVENTIONS: 97168 OT Re-evaluation, 97535 self care/ADL training, [address] therapeutic exercise, 97530 therapeutic activity, 97112 neuromuscular re-education, 97140 manual therapy, 97035 ultrasound, 97018 paraffin, 60454 fluidotherapy, 97760 Orthotics management and training, 09811 Splinting (initial encounter), [id number] Subsequent splinting/medication, scar mobilization, passive range of motion, functional mobility training, energy conservation, patient/family education, and DME and/or AE  instructions  RECOMMENDED OTHER SERVICES: n/a  CONSULTED AND AGREED WITH PLAN OF CARE: Patient  PLAN FOR NEXT SESSION:   Re-cert and schedule additional visits, 1-2 at a time  Monitor skin integrity of digit 5 after splint removal  Review HEP PRN - how did theraputty go?  Handout - pain management (ice and heat modalities)  Handout - joint protection  A/E and adaptive strategies for ADL/IADL (see LTG #3 Baseline for list of ADL/IADL tasks)     For all possible CPT codes, reference the Planned Interventions line above.     Check all conditions that are expected to impact treatment: {Conditions expected to impact treatment:Musculoskeletal disorders   If treatment provided at initial evaluation, no treatment charged due to lack of authorization.        Wynetta Emery, OT 04/26/2023, 11:25 AM

## 2023-05-03 ENCOUNTER — Ambulatory Visit: Payer: Medicaid Other | Admitting: Occupational Therapy

## 2023-05-03 ENCOUNTER — Encounter: Payer: Self-pay | Admitting: Occupational Therapy

## 2023-05-03 DIAGNOSIS — M6281 Muscle weakness (generalized): Secondary | ICD-10-CM

## 2023-05-03 DIAGNOSIS — R208 Other disturbances of skin sensation: Secondary | ICD-10-CM

## 2023-05-03 DIAGNOSIS — R29898 Other symptoms and signs involving the musculoskeletal system: Secondary | ICD-10-CM

## 2023-05-03 NOTE — Therapy (Signed)
 [facility] [address] [place], [place], [address] Phone: [phone]   Fax:  [phone]  Patient Details  Name: Isabel Blackwell MRN: [id number] Date of Birth: [birth year]  OCCUPATIONAL THERAPY DISCHARGE SUMMARY  Visits from Start of Care: 3  Pt contacted clinic to cancel today's OT appointment d/t work. Today was last scheduled OT visit. Pt did not want to reschedule.   Current functional level related to goals / functional outcomes: Unable to assess goals.  Remaining deficits: Chronic condition, pt may have remaining deficits, see tx notes for additional details.   Education / Equipment: Pt has some needed materials and education. See tx notes for more details.    Patient goals were unable to be assessed. Patient is being discharged due to the patient's request.   If additional OT services are recommended, a new OT referral will be required.    Wynetta Emery, OT 05/03/2023, 12:08 PM  [facility] [address] [place], [place], [address] Phone: [phone]   Fax:  [phone]

## 2023-05-04 ENCOUNTER — Encounter: Payer: Self-pay | Admitting: Emergency Medicine

## 2023-05-04 ENCOUNTER — Ambulatory Visit
Admission: EM | Admit: 2023-05-04 | Discharge: 2023-05-04 | Disposition: A | Discharge status: Home / Self Care | Attending: Nurse Practitioner | Admitting: Nurse Practitioner

## 2023-05-04 DIAGNOSIS — Z113 Encounter for screening for infections with a predominantly sexual mode of transmission: Secondary | ICD-10-CM | POA: Diagnosis not present

## 2023-05-04 DIAGNOSIS — N898 Other specified noninflammatory disorders of vagina: Secondary | ICD-10-CM | POA: Diagnosis not present

## 2023-05-04 NOTE — ED Triage Notes (Signed)
 Odor from vaginal area since yesterday.  States having some clear vaginal discharge.

## 2023-05-04 NOTE — Discharge Instructions (Signed)
 Your results should be available within the next 48 to 72 hours.  If you have access to MyChart, you will be able to see the results there.  If your results are positive, you will be contacted to discuss treatment. Increase condom use. Refrain from sexual intercourse until sex results are received. If test results are positive, you will need to notify all partners.  Additionally, if you test positive and require treatment, you will need to refrain from sexual activity for an additional 7 days after completing treatment. Follow-up as needed.

## 2023-05-04 NOTE — ED Provider Notes (Signed)
 [facility]    CSN: [id number] Arrival date & time: 05/04/23  6295      History   Chief Complaint No chief complaint on file.   HPI Isabel Blackwell is a 28 y.o. female.   The history is provided by the patient.   Patient presents with a 1 day history of vaginal odor and vaginal discharge.  Symptoms started over the past 24 hours.  States discharge is "clear".  Denies vaginal itching, fever, chills, abdominal pain, pelvic pain, pain with urination, frequency, urgency, hesitancy, or low back pain.  Patient reports 1 female partner in the past 90 days.  Denies prior history of STI/STD.  LMP: 04/11/2023.  Past Medical History:  Diagnosis Date   Depression    Nasal congestion 12/04/2012   Tonsillar and adenoid hypertrophy 11/2012   snores during sleep, mother denies apnea    Patient Active Problem List   Diagnosis Date Noted   BV (bacterial vaginosis) 02/22/2023   Laceration of right little finger 02/22/2023   Decreased range of motion of finger of right hand 02/22/2023   Inability to conceive, female 01/03/2023   Cyst of right ovary 01/03/2023   Pelvic pain 12/14/2022   Vaginal discharge 11/19/2022   Routine screening for STI (sexually transmitted infection) 09/13/2022   Dyspareunia in female 06/19/2022   Encounter for routine adult physical exam with abnormal findings 06/08/2022   Dizziness 06/08/2022   Severe episode of recurrent major depressive disorder, without psychotic features (HCC)    MDD (major depressive disorder), recurrent episode, severe (HCC) 12/05/2014   Insomnia 03/09/2014   Loss of weight 03/09/2014   Depression 03/09/2014   Neuropathy 03/05/2012    Past Surgical History:  Procedure Laterality Date   COLONOSCOPY N/A 04/13/2014   BX NL-ILEUM, COLON, RECTUM   TONSILLECTOMY     TONSILLECTOMY AND ADENOIDECTOMY Bilateral 12/09/2012   Procedure: BILATERAL TONSILLECTOMY AND ADENOIDECTOMY;  Surgeon: Darletta Moll, MD;  Location: [facility];  Service: ENT;  Laterality: Bilateral;   WISDOM TOOTH EXTRACTION      OB History     Gravida  0   Para  0   Term  0   Preterm  0   AB  0   Living  0      SAB  0   IAB  0   Ectopic  0   Multiple  0   Live Births  0            Home Medications    Prior to Admission medications   Medication Sig Start Date End Date Taking? Authorizing Provider  clomiPHENE (CLOMID) 50 MG tablet Take 1 tablet (50 mg total) by mouth daily. On days 1-5 of menstrual cycle 01/03/23   Cresenzo-Dishmon, Scarlette Calico, CNM  Prenatal Vit-Fe Fumarate-FA (PNV PRENATAL PLUS MULTIVITAMIN) 27-1 MG TABS Take 1 tablet by mouth daily. 01/03/23   Cresenzo-Dishmon, Scarlette Calico, CNM    Family History Family History  Problem Relation Age of Onset   Hypertension Mother    Heart disease Maternal Grandfather    Colon cancer Neg Hx     Social History Social History   Tobacco Use   Smoking status: Every Day    Current packs/day: 0.50    Types: Cigarettes   Smokeless tobacco: Never  Vaping Use   Vaping status: Never Used  Substance Use Topics   Alcohol use: Not Currently    Comment: "every now and then"   Drug use: Yes    Frequency:  1.0 times per week    Types: Marijuana    Comment: last used "a few days ago" as of 01/24/16     Allergies   Nitrofurantoin monohyd macro   Review of Systems Review of Systems Per HPI  Physical Exam Triage Vital Signs ED Triage Vitals  Encounter Vitals Group     BP 05/04/23 0952 118/83     Systolic BP Percentile --      Diastolic BP Percentile --      Pulse Rate 05/04/23 0952 98     Resp 05/04/23 0952 18     Temp 05/04/23 0952 98.3 F (36.8 C)     Temp Source 05/04/23 0952 Oral     SpO2 05/04/23 0952 96 %     Weight --      Height --      Head Circumference --      Peak Flow --      Pain Score 05/04/23 0953 0     Pain Loc --      Pain Education --      Exclude from Growth Chart --    No data found.  Updated Vital Signs BP 118/83 (BP  Location: Right Arm)   Pulse 98   Temp 98.3 F (36.8 C) (Oral)   Resp 18   LMP 04/15/2023 (Exact Date)   SpO2 96%   Visual Acuity Right Eye Distance:   Left Eye Distance:   Bilateral Distance:    Right Eye Near:   Left Eye Near:    Bilateral Near:     Physical Exam Vitals and nursing note reviewed.  Constitutional:      General: She is not in acute distress.    Appearance: Normal appearance.  HENT:     Head: Normocephalic.  Eyes:     Extraocular Movements: Extraocular movements intact.     Pupils: Pupils are equal, round, and reactive to light.  Cardiovascular:     Rate and Rhythm: Normal rate and regular rhythm.  Pulmonary:     Effort: Pulmonary effort is normal.     Breath sounds: Normal breath sounds.  Musculoskeletal:     Cervical back: Normal range of motion.  Neurological:     Mental Status: She is alert.  Psychiatric:        Mood and Affect: Mood normal.        Behavior: Behavior normal.      UC Treatments / Results  Labs (all labs ordered are listed, but only abnormal results are displayed) Labs Reviewed  RPR  HIV ANTIBODY (ROUTINE TESTING W REFLEX)  CERVICOVAGINAL ANCILLARY ONLY    EKG   Radiology No results found.  Procedures Procedures (including critical care time)  Medications Ordered in UC Medications - No data to display  Initial Impression / Assessment and Plan / UC Course  I have reviewed the triage vital signs and the nursing notes.  Pertinent labs & imaging results that were available during my care of the patient were reviewed by me and considered in my medical decision making (see chart for details).  Cytology swab and HIV/RPR test are pending.  Supportive care recommendations were provided and discussed with the patient to include increasing condom use, notifying all partners if test results are positive.  Patient was in agreement with this plan of care and verbalizes understanding.  All questions were answered.  Patient  stable for discharge.   Final Clinical Impressions(s) / UC Diagnoses   Final diagnoses:  Screening examination for sexually  transmitted disease  Vaginal discharge  Vaginal odor     Discharge Instructions      Your results should be available within the next 48 to 72 hours.  If you have access to MyChart, you will be able to see the results there.  If your results are positive, you will be contacted to discuss treatment. Increase condom use. Refrain from sexual intercourse until sex results are received. If test results are positive, you will need to notify all partners.  Additionally, if you test positive and require treatment, you will need to refrain from sexual activity for an additional 7 days after completing treatment. Follow-up as needed.      ED Prescriptions   None    PDMP not reviewed this encounter.   Abran Cantor, NP 05/04/23 1008

## 2023-05-05 LAB — HIV ANTIBODY (ROUTINE TESTING W REFLEX): HIV Screen 4th Generation wRfx: NONREACTIVE

## 2023-05-05 LAB — RPR: RPR Ser Ql: NONREACTIVE

## 2023-05-07 ENCOUNTER — Telehealth (HOSPITAL_COMMUNITY): Payer: Self-pay

## 2023-05-07 LAB — CERVICOVAGINAL ANCILLARY ONLY
Bacterial Vaginitis (gardnerella): POSITIVE — AB
Candida Glabrata: NEGATIVE
Candida Vaginitis: NEGATIVE
Chlamydia: NEGATIVE
Comment: NEGATIVE
Comment: NEGATIVE
Comment: NEGATIVE
Comment: NEGATIVE
Comment: NEGATIVE
Comment: NORMAL
Neisseria Gonorrhea: NEGATIVE
Trichomonas: NEGATIVE

## 2023-05-07 MED ORDER — METRONIDAZOLE 500 MG PO TABS: 500.0000 mg | ORAL_TABLET | Freq: Two times a day (BID) | ORAL | 0 refills | Status: AC

## 2023-05-07 NOTE — Telephone Encounter (Signed)
 Per protocol, pt requires tx with metronidazole. Rx sent to pharmacy on file.

## 2023-05-15 DIAGNOSIS — Z319 Encounter for procreative management, unspecified: Secondary | ICD-10-CM | POA: Diagnosis not present

## 2023-05-22 ENCOUNTER — Encounter: Payer: Self-pay | Admitting: Obstetrics & Gynecology

## 2023-05-25 LAB — FOLLICLE STIMULATING HORMONE: FSH: 7.8 m[IU]/mL

## 2023-05-25 LAB — LUTEINIZING HORMONE: LH: 12.2 m[IU]/mL

## 2023-05-25 LAB — HEMOGLOBIN A1C
Est. average glucose Bld gHb Est-mCnc: 120 mg/dL
Hgb A1c MFr Bld: 5.8 % — ABNORMAL HIGH (ref 4.8–5.6)

## 2023-05-25 LAB — PROLACTIN: Prolactin: 16.4 ng/mL (ref 4.8–33.4)

## 2023-05-25 LAB — ESTRADIOL: Estradiol: 19.2 pg/mL

## 2023-05-25 LAB — TSH: TSH: 2.04 u[IU]/mL (ref 0.450–4.500)

## 2023-05-25 LAB — TESTOSTERONE, TOTAL, LC/MS/MS: Testosterone, total: 27.7 ng/dL (ref 10.0–55.0)

## 2023-05-25 LAB — ANTI MULLERIAN HORMONE: ANTI-MULLERIAN HORMONE (AMH): 10.9 ng/mL

## 2023-05-29 ENCOUNTER — Telehealth: Payer: Self-pay | Admitting: Family Medicine

## 2023-05-29 NOTE — Telephone Encounter (Signed)
 Copied from CRM [id number]. Topic: Referral - Question >> May 29, 2023 10:36 AM DeAngela L wrote: Reason for CRM: Patient calling asking if her Dr office could refer her to a dermatologist she spoke with derms office that accepts her insurance and they would schedule after she has a referral from her dr  [facility]  [address] [place] [address] phone number [phone] fax [phone]

## 2023-05-29 NOTE — Telephone Encounter (Signed)
 Please advise if you agree to place this referral request for this pt. :  Reason for CRM: Patient calling asking if her Dr office could refer her to a dermatologist she spoke with derms office that accepts her insurance and they would schedule after she has a referral from her dr  [facility]  [address] [place] [address] phone number [phone] fax [phone]

## 2023-05-29 NOTE — Telephone Encounter (Signed)
 Hi please let patient know referral was placed on 03/08/23

## 2023-05-30 NOTE — Telephone Encounter (Signed)
 Pt. Notified and given contact information.

## 2023-07-08 ENCOUNTER — Ambulatory Visit: Payer: Self-pay

## 2023-07-08 VITALS — BP 114/77 | HR 78 | Resp 18 | Ht 62.0 in | Wt 102.0 lb

## 2023-07-08 DIAGNOSIS — Z113 Encounter for screening for infections with a predominantly sexual mode of transmission: Secondary | ICD-10-CM

## 2023-07-08 NOTE — Progress Notes (Signed)
   Established Patient Office Visit  Subjective   Patient ID: Isabel Blackwell, female    DOB: [birth year]  Age: 28 y.o. MRN: [id number]  Chief Complaint  Patient presents with   Exposure to STD    Wanting to get std testing done. No sx's     HPI STD check  Patient Active Problem List   Diagnosis Date Noted   BV (bacterial vaginosis) 02/22/2023   Laceration of right little finger 02/22/2023   Decreased range of motion of finger of right hand 02/22/2023   Inability to conceive, female 01/03/2023   Cyst of right ovary 01/03/2023   Pelvic pain 12/14/2022   Vaginal discharge 11/19/2022   Routine screening for STI (sexually transmitted infection) 09/13/2022   Dyspareunia in female 06/19/2022   Encounter for routine adult physical exam with abnormal findings 06/08/2022   Dizziness 06/08/2022   Severe episode of recurrent major depressive disorder, without psychotic features (HCC)    MDD (major depressive disorder), recurrent episode, severe (HCC) 12/05/2014   Insomnia 03/09/2014   Loss of weight 03/09/2014   Depression 03/09/2014   Neuropathy 03/05/2012   Past Medical History:  Diagnosis Date   Depression    Nasal congestion 12/04/2012   Tonsillar and adenoid hypertrophy 11/2012   snores during sleep, mother denies apnea      ROS    Objective:     BP 114/77   Pulse 78   Resp 18   Ht 5\' 2"  (1.575 m)   Wt 102 lb 0.6 oz (46.3 kg)   SpO2 95%   BMI 18.66 kg/m    Physical Exam Vitals and nursing note reviewed.  Constitutional:      Appearance: Normal appearance.  Eyes:     Extraocular Movements: Extraocular movements intact.     Pupils: Pupils are equal, round, and reactive to light.  Cardiovascular:     Rate and Rhythm: Normal rate and regular rhythm.  Pulmonary:     Effort: Pulmonary effort is normal.     Breath sounds: Normal breath sounds.  Neurological:     Mental Status: She is alert and oriented to person, place, and time.  Psychiatric:        Mood  and Affect: Mood normal.        Thought Content: Thought content normal.      No results found for any visits on 07/08/23.    The ASCVD Risk score (Arnett DK, et al., 2019) failed to calculate for the following reasons:   The 2019 ASCVD risk score is only valid for ages 55 to 106    Assessment & Plan:   Problem List Items Addressed This Visit   None Visit Diagnoses       Screening for STD (sexually transmitted disease)    -  Primary   Relevant Orders   GC/Chlamydia Probe Amp([facility])   HIV antibody (with reflex)   RPR   Hepatitis C Antibody       No follow-ups on file.    Alison Irvine, FNP

## 2023-07-09 LAB — RPR: RPR Ser Ql: NONREACTIVE

## 2023-07-09 LAB — HSV 1 AND 2 AB, IGG
HSV 1 Glycoprotein G Ab, IgG: REACTIVE — AB
HSV 2 IgG, Type Spec: REACTIVE — AB

## 2023-07-09 LAB — HIV ANTIBODY (ROUTINE TESTING W REFLEX): HIV Screen 4th Generation wRfx: NONREACTIVE

## 2023-07-09 LAB — HEPATITIS C ANTIBODY: Hep C Virus Ab: NONREACTIVE

## 2023-07-10 ENCOUNTER — Ambulatory Visit: Payer: Self-pay

## 2023-07-10 LAB — GC/CHLAMYDIA PROBE AMP
Chlamydia trachomatis, NAA: NEGATIVE
Neisseria Gonorrhoeae by PCR: NEGATIVE

## 2023-07-11 ENCOUNTER — Other Ambulatory Visit: Payer: Self-pay

## 2023-07-11 DIAGNOSIS — B009 Herpesviral infection, unspecified: Secondary | ICD-10-CM

## 2023-07-11 MED ORDER — VALACYCLOVIR HCL 500 MG PO TABS: 500.0000 mg | ORAL_TABLET | Freq: Every day | ORAL | 3 refills | Status: AC

## 2023-09-05 DIAGNOSIS — N760 Acute vaginitis: Secondary | ICD-10-CM | POA: Diagnosis not present

## 2023-09-25 ENCOUNTER — Encounter: Payer: Self-pay | Admitting: Adult Health

## 2023-09-25 ENCOUNTER — Ambulatory Visit: Admitting: Adult Health

## 2023-09-25 VITALS — BP 111/78 | HR 75 | Ht 62.0 in | Wt 98.5 lb

## 2023-09-25 DIAGNOSIS — N941 Unspecified dyspareunia: Secondary | ICD-10-CM

## 2023-09-25 DIAGNOSIS — Z319 Encounter for procreative management, unspecified: Secondary | ICD-10-CM

## 2023-09-25 NOTE — Progress Notes (Signed)
  Subjective:     Patient ID: Isabel Blackwell, female   DOB: 06/15/1995, 28 y.o.   MRN: [id number]  HPI Isabel Blackwell is a 28 year old black female,single, G0P0, in complaining of pain with sex. She wants to get pregnant.  Last pap 2023 NILM  PCP is I Polanco   Review of Systems +pain with sex  Reviewed past medical,surgical, social and family history. Reviewed medications and allergies.     Objective:   Physical Exam BP 111/78 (BP Location: Left Arm, Patient Position: Sitting, Cuff Size: Normal)   Pulse 75   Ht 5' 2 (1.575 m)   Wt 98 lb 8 oz (44.7 kg)   LMP 09/06/2023 (Exact Date)   BMI 18.02 kg/m     Skin warm and dry.Pelvic: external genitalia is normal in appearance no lesions, vagina: pink,urethra has no lesions or masses noted, cervix:smooth and nulliparous, uterus: normal size, shape and contour, non tender, no masses felt, adnexa: no masses or tenderness noted. Bladder is non tender and no masses felt. Has pain when I move cervix around, feel like that with sex.   [facility] - 09/25/23 1359       Pregnancy Intention Screening   Does the patient want to become pregnant in the next year? Yes    Does the patient's partner want to become pregnant in the next year? Yes    Would the patient like to discuss contraceptive options today? No      Contraception Wrap Up   Current Method Pregnant/Seeking Pregnancy    End Method Pregnant/Seeking Pregnancy    Contraception Counseling Provided No         Examination chaperoned by Clarita Salt LPN  Assessment:     1. Dyspareunia in female (Primary) Has pain with sex, felt to be contact  Try different positions with sex, and try pillow under hips  2. Patient desires pregnancy Check progesterone  level 09/26/23 to see if ovulated, has used clomid  in the past Had normal HSG Discussed timing of sex and to pee before and lay there after  - Progesterone      Plan:     Follow up prn

## 2023-09-26 DIAGNOSIS — Z319 Encounter for procreative management, unspecified: Secondary | ICD-10-CM | POA: Diagnosis not present

## 2023-09-27 ENCOUNTER — Ambulatory Visit: Payer: Self-pay | Admitting: Adult Health

## 2023-09-27 LAB — PROGESTERONE: Progesterone: 17.5 ng/mL

## 2023-10-03 ENCOUNTER — Ambulatory Visit: Payer: Self-pay | Admitting: Family Medicine

## 2023-10-03 VITALS — BP 109/74 | HR 86 | Ht 62.0 in | Wt 101.0 lb

## 2023-10-03 DIAGNOSIS — F339 Major depressive disorder, recurrent, unspecified: Secondary | ICD-10-CM

## 2023-10-03 MED ORDER — ESCITALOPRAM OXALATE 5 MG PO TABS: 5.0000 mg | ORAL_TABLET | Freq: Every day | ORAL | 2 refills | Status: DC

## 2023-10-03 MED ORDER — BUSPIRONE HCL 5 MG PO TABS: 5.0000 mg | ORAL_TABLET | Freq: Two times a day (BID) | ORAL | 2 refills | Status: AC | PRN

## 2023-10-03 NOTE — Assessment & Plan Note (Signed)
 Flowsheet Row Office Visit from 10/03/2023 in [facility]  PHQ-9 Total Score 10   Started Lexapro 5 mg once daily and Buspar 5 mg PRN   We discussed several non-pharmacological approaches to managing anxiety and depression, including: Establishing a consistent daily routine: This helps create structure and stability. Practicing mindfulness and relaxation techniques: Incorporating meditation, deep breathing exercises, or yoga to manage stress and improve emotional well-being. Engaging in regular physical activity: Aim for at least 30 minutes of exercise most days to boost mood and energy levels. Spending time outdoors: Exposure to natural light and fresh air can improve mental health. Building a support network: Encouraging social connections with friends, family, or support groups to reduce feelings of isolation. Prioritizing a balanced diet: Eating nutrient-rich foods while avoiding excessive amounts of processed foods, sugar, and unhealthy fats. Follow-up is recommended in 4-8 weeks to assess progress,  Patient denied referral to behavioral health for counseling
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
## 2023-10-03 NOTE — Patient Instructions (Signed)

## 2023-10-11 ENCOUNTER — Encounter: Payer: Self-pay | Admitting: Radiology

## 2023-10-16 ENCOUNTER — Emergency Department (HOSPITAL_COMMUNITY)
Admission: EM | Admit: 2023-10-16 | Discharge: 2023-10-16 | Disposition: A | Discharge status: Home / Self Care | Attending: Emergency Medicine | Admitting: Emergency Medicine

## 2023-10-16 ENCOUNTER — Other Ambulatory Visit: Payer: Self-pay

## 2023-10-16 ENCOUNTER — Emergency Department (HOSPITAL_COMMUNITY)

## 2023-10-16 DIAGNOSIS — X509XXA Other and unspecified overexertion or strenuous movements or postures, initial encounter: Secondary | ICD-10-CM | POA: Insufficient documentation

## 2023-10-16 DIAGNOSIS — Y9301 Activity, walking, marching and hiking: Secondary | ICD-10-CM | POA: Diagnosis not present

## 2023-10-16 DIAGNOSIS — S99822A Other specified injuries of left foot, initial encounter: Secondary | ICD-10-CM | POA: Diagnosis not present

## 2023-10-16 DIAGNOSIS — S99922A Unspecified injury of left foot, initial encounter: Secondary | ICD-10-CM | POA: Diagnosis not present

## 2023-10-16 DIAGNOSIS — M7989 Other specified soft tissue disorders: Secondary | ICD-10-CM | POA: Diagnosis not present

## 2023-10-16 DIAGNOSIS — M79672 Pain in left foot: Secondary | ICD-10-CM | POA: Diagnosis not present

## 2023-10-16 MED ORDER — HYDROCODONE-ACETAMINOPHEN 5-325 MG PO TABS
1.0000 | ORAL_TABLET | Freq: Once | ORAL | Status: AC
  Administered 2023-10-16: 1 via ORAL
  Filled 2023-10-16: qty 1

## 2023-10-16 NOTE — Discharge Instructions (Signed)
 You were seen for your sprain in the emergency department.   At home, please take tylenol  and ibuprofen  for your pain.  Please rest your joint and elevate it.  Use ice to limit the swelling.   You may bear weight as tolerated. Wear the post op shoe and use crutches for comfort.   Check your MyChart online for the results of any tests that had not resulted by the time you left the emergency department.   Follow-up with orthopedics or sports medicine in 1 week.  Return immediately to the emergency department if you experience any of the following: Worsening pain, or any other concerning symptoms.    Thank you for visiting our Emergency Department. It was a pleasure taking care of you today.

## 2023-10-16 NOTE — ED Provider Notes (Signed)
 [name] EMER[facility]ENC[facility] DEPART[facility] AT [facility] Provider Note   CSN: [id number] Arrival date & time: 10/16/23  9177     Patient presents with: Foot Injury   Isabel Blackwell is a 28 y.o. female.   28 year old female who presents emergency department foot pain.  Patient reports that she was walking down the steps when she stepped on an inverted left foot.  Has been having sharp pain in her foot.  No ankle pain.  Has not been able to bear weight on her foot because of the pain.       Prior to Admission medications   Medication Sig Start Date End Date Taking? Authorizing Provider  busPIRone  (BUSPAR ) 5 MG tablet Take 1 tablet (5 mg total) by mouth 2 (two) times daily as needed (Anxiety). 10/03/23   Del Wilhelmena Lloyd Sola, FNP  clomiPHENE  (CLOMID ) 50 MG tablet Take 1 tablet (50 mg total) by mouth daily. On days 1-5 of menstrual cycle 01/03/23   Cresenzo-Dishmon, Cathlean, CNM  escitalopram  (LEXAPRO ) 5 MG tablet Take 1 tablet (5 mg total) by mouth daily. 10/03/23   Del Wilhelmena Lloyd Sola, FNP  Prenatal Vit-Fe Fumarate-FA (PNV PRENATAL PLUS MULTIVITAMIN) 27-1 MG TABS Take 1 tablet by mouth daily. 01/03/23   Cresenzo-Dishmon, Cathlean, CNM  valACYclovir  (VALTREX ) 500 MG tablet Take 1 tablet (500 mg total) by mouth daily. 07/11/23   Bevely Doffing, FNP    Allergies: Nitrofurantoin monohyd macro    Review of Systems  Updated Vital Signs BP 101/70   Pulse 67   Temp 98.3 F (36.8 C) (Oral)   Resp 18   LMP 09/06/2023 (Exact Date)   SpO2 98%   Physical Exam Musculoskeletal:     Comments: No tenderness to palpation of distal tibia and fibula.  Still able to range ankle.  No joint effusion of the ankle.  Tenderness to palpation of the 5th through 3rd metatarsals of the left foot.  Compartments of the foot soft.  DP pulse 2+ in the left foot.  Capillary refill less than 2 seconds in all toes of the left foot.  Intact sensation to light touch of all dermatomes of the foot.      (all labs ordered are listed, but only abnormal results are displayed) Labs Reviewed - No data to display  EKG: None  Radiology: DG Foot Complete Left Addendum Date: 10/16/2023 ADDENDUM REPORT: 10/16/2023 09:46 ADDENDUM: A PA view of the foot is now available. This also demonstrates normal-appearing bones and soft tissues with no fracture or dislocation. Electronically Signed   By: Elspeth Bathe M.D.   On: 10/16/2023 09:46   Result Date: 10/16/2023 CLINICAL DATA:  Dorsal and lateral left foot pain and swelling after falling down stairs this morning. EXAM: LEFT FOOT - COMPLETE 3+ VIEW COMPARISON:  None Available. FINDINGS: Two oblique and 1 lateral view of the left foot are submitted for interpretation. There is no AP view. The bones and soft tissues have normal appearances on these views. No fracture or dislocation seen. IMPRESSION: Limited examination due to the lack of an AP view with no abnormality seen. Electronically Signed: By: Elspeth Bathe M.D. On: 10/16/2023 09:14     Procedures   Medications Ordered in the ED  HYDROcodone -acetaminophen  (NORCO/VICODIN) 5-325 MG per tablet 1 tablet (1 tablet Oral Given 10/16/23 0906)  Medical Decision Making Amount and/or Complexity of Data Reviewed Radiology: ordered.  Risk Prescription drug management.   28 year old female who presents emergency ferment with foot pain after an inversion injury  Initial Ddx:  Ankle sprain, distal fibula fracture, metatarsal fracture, compartment syndrome  MDM/Course:  Patient presents emergency department with foot pain after an inversion injury.  On exam there are no obvious deformities.  She is neurovascularly intact.  No signs of compartment syndrome.  No distal fibular tenderness or tibial tenderness to suggest ankle fracture.  No ankle effusion.  X-rays of the foot do not show acute abnormality.  Distal fibula and tibia are also visualized and did not show any  fracture on my read.  Suspect that she likely just has a contusion causing her pain was given a postop shoe and crutches and instructed to be weightbearing as tolerated for her foot injury.  Will have her follow-up with orthopedics if her symptoms do not improve in several weeks  This patient presents to the ED for concern of complaints listed in HPI, this involves an extensive number of treatment options, and is a complaint that carries with it a high risk of complications and morbidity. Disposition including potential need for admission considered.   Dispo: DC Home. Return precautions discussed including, but not limited to, those listed in the AVS. Allowed pt time to ask questions which were answered fully prior to dc.  Records reviewed Outpatient Clinic Notes I independently reviewed the following imaging with scope of interpretation limited to determining acute life threatening conditions related to emergency care: Extremity x-ray(s) and agree with the radiologist interpretation with the following exceptions: none I have reviewed the patients home medications and made adjustments as needed  Portions of this note were generated with Dragon dictation software. Dictation errors may occur despite best attempts at proofreading.     Final diagnoses:  Injury of left foot, initial encounter    ED Discharge Orders     None          Yolande Lamar BROCKS, MD 10/16/23 1431

## 2023-10-16 NOTE — ED Triage Notes (Signed)
 Pt c/o falling down the a step this morning but did not like fall completely down, I didn't hit my head or anything c/o L foot pain and states feels like it went inward on me when I mis stepped states she cannot bear weight on L foot now and rates pain 8/10 that is sharp in nature

## 2023-10-23 ENCOUNTER — Encounter: Payer: Self-pay | Admitting: Orthopaedic Surgery

## 2023-10-23 ENCOUNTER — Ambulatory Visit: Admitting: Orthopaedic Surgery

## 2023-10-23 VITALS — BP 109/79 | HR 75 | Ht 62.0 in | Wt 104.0 lb

## 2023-10-23 DIAGNOSIS — S96912A Strain of unspecified muscle and tendon at ankle and foot level, left foot, initial encounter: Secondary | ICD-10-CM | POA: Diagnosis not present

## 2023-10-23 NOTE — Progress Notes (Signed)
 I slipped on the stairs.  She turned her ankle on the left coming down her stairs at home.  She went to the ER.  Xray were negative.  She was given a post op shoe.  She still has pain and a limp.  She has no other injury.  I have reviewed the notes and X-rays from the ER.  I have independently reviewed and interpreted x-rays of this patient done at another site by another physician or qualified health professional.  She is tender over the left lateral ankle and the anterior talofibular ligament.  She has some swelling but no ecchymosis.  NV intact. ROM tender but full.  Encounter Diagnosis  Name Primary?   Strain of left ankle, initial encounter Yes   I will give CAM walker and instructions for contrast baths.  Return in two weeks.  Call if any problem.  Precautions discussed.  Electronically Signed Lemond Stable, MD 9/3/20259:05 AM

## 2023-11-13 ENCOUNTER — Encounter: Payer: Self-pay | Admitting: Dermatology

## 2023-11-13 ENCOUNTER — Ambulatory Visit: Admitting: Orthopaedic Surgery

## 2023-11-13 ENCOUNTER — Encounter: Payer: Self-pay | Admitting: Orthopaedic Surgery

## 2023-11-13 ENCOUNTER — Ambulatory Visit: Admitting: Dermatology

## 2023-11-13 VITALS — BP 100/66 | HR 76

## 2023-11-13 DIAGNOSIS — L810 Postinflammatory hyperpigmentation: Secondary | ICD-10-CM

## 2023-11-13 DIAGNOSIS — D485 Neoplasm of uncertain behavior of skin: Secondary | ICD-10-CM

## 2023-11-13 DIAGNOSIS — S96912D Strain of unspecified muscle and tendon at ankle and foot level, left foot, subsequent encounter: Secondary | ICD-10-CM | POA: Diagnosis not present

## 2023-11-13 DIAGNOSIS — L700 Acne vulgaris: Secondary | ICD-10-CM | POA: Diagnosis not present

## 2023-11-13 DIAGNOSIS — D492 Neoplasm of unspecified behavior of bone, soft tissue, and skin: Secondary | ICD-10-CM | POA: Diagnosis not present

## 2023-11-13 DIAGNOSIS — L820 Inflamed seborrheic keratosis: Secondary | ICD-10-CM | POA: Diagnosis not present

## 2023-11-13 MED ORDER — CLINDAMYCIN PHOSPHATE 1 % EX SWAB: 1.0000 | Freq: Every morning | CUTANEOUS | 9 refills | Status: AC

## 2023-11-13 MED ORDER — AZELAIC ACID 15 % EX GEL: 1.0000 | Freq: Two times a day (BID) | CUTANEOUS | 4 refills | Status: AC

## 2023-11-13 NOTE — Progress Notes (Signed)
   New Patient Visit   Subjective  Isabel Blackwell is a 28 y.o. female who presents for the following: Nevus  Patient states she has nevus located at the left upper thigh that she would like to have examined. Patient reports the areas have been there for 10 years. She reports the areas are bothersome. Patient reports that the spot will sometimes bleed and go away but then will grow back. Patient rates irritation 6 out of 10. She states that the areas have not spread. Patient reports she has not previously been treated for these areas. Patient declines Hx of bx. Patient declines family history of skin cancer(s).  Patient would also like to discuss acne scarring and pih she has located at the face that she would like to have examined. Patient reports the areas have been there for 4 years. She reports the areas are not bothersome.Patient rates irritation 1 out of 10. She states that the areas have not spread. Patient reports she has not previously been treated for these areas. Patient uses cocoa butter as a moisturizer for face and only washes with water  in the morning. At night she reports that she is currently using a green tea charcoal facial wash . Patient reports she is currently trying to conceive.   The following portions of the chart were reviewed this encounter and updated as appropriate: medications, allergies, medical history  Review of Systems:  No other skin or systemic complaints except as noted in HPI or Assessment and Plan.  Objective  Well appearing patient in no apparent distress; mood and affect are within normal limits.  A focused examination was performed of the following areas: Upper left thigh and face  Relevant exam findings are noted in the Assessment and Plan.             Assessment & Plan   Left lateral thigh skin lesion 5 mm black crusted papule, likely irritated seborrheic keratosis or dermatofibroma. Biopsy needed for definitive diagnosis. - Perform shave  biopsy of the left lateral thigh lesion. - Send biopsy specimen for histopathological examination. - Inform her of biopsy results within a week, especially if benign.  Acne vulgaris with postinflammatory hyperpigmentation Inflammatory acne on cheeks and chin with postinflammatory hyperpigmentation. Limited treatment options due to conception attempt. Safe alternatives include topical clindamycin  and azelaic acid . Improvement may take up to 12 weeks.  - Instruct to wash face with gentle cleanser, avoiding salicylic acid. - Prescribe clindamycin  swabs for morning use. - Prescribe azelaic acid  15% cream for nighttime use. - Recommend Avene Tolerance moisturizer. - Advise that improvement may take up to 12 weeks. - Reassess treatment postpartum and after breastfeeding for potential oral medications and stronger topicals.   No follow-ups on file.  LILLETTE Lyle Cords, am acting as Neurosurgeon for [employer], DO.   Documentation: I have reviewed the above documentation for accuracy and completeness, and I agree with the above.  Delon Lenis, DO

## 2023-11-13 NOTE — Patient Instructions (Addendum)
 Patient Handout: Wound Care for Skin Biopsy Site  Taking Care of Your Skin Biopsy Site  Proper care of the biopsy site is essential for promoting healing and minimizing scarring. This handout provides instructions on how to care for your biopsy site to ensure optimal recovery.  1. Cleaning the Wound:  Clean the biopsy site daily with gentle soap and water . Gently pat the area dry with a clean, soft towel. Avoid harsh scrubbing or rubbing the area, as this can irritate the skin and delay healing.  2. Applying Aquaphor and Bandage:  After cleaning the wound, apply a thin layer of Aquaphor ointment to the biopsy site. Cover the area with a sterile bandage to protect it from dirt, bacteria, and friction. Change the bandage daily or as needed if it becomes soiled or wet.  3. Continued Care for One Week:  Repeat the cleaning, Aquaphor application, and bandaging process daily for one week following the biopsy procedure. Keeping the wound clean and moist during this initial healing period will help prevent infection and promote optimal healing.  4. Massaging Aquaphor into the Area:  ---After one week, discontinue the use of bandages but continue to apply Aquaphor to the biopsy site. ----Gently massage the Aquaphor into the area using circular motions. ---Massaging the skin helps to promote circulation and prevent the formation of scar tissue.   Additional Tips:  Avoid exposing the biopsy site to direct sunlight during the healing process, as this can cause hyperpigmentation or worsen scarring. If you experience any signs of infection, such as increased redness, swelling, warmth, or drainage from the wound, contact your healthcare provider immediately. Follow any additional instructions provided by your healthcare provider for caring for the biopsy site and managing any discomfort. Conclusion:  Taking proper care of your skin biopsy site is crucial for ensuring optimal healing and  minimizing scarring. By following these instructions for cleaning, applying Aquaphor, and massaging the area, you can promote a smooth and successful recovery. If you have any questions or concerns about caring for your biopsy site, don't hesitate to contact your healthcare provider for guidance.   Date: Wed Nov 13 2023  Hello Isabel Blackwell,  Thank you for visiting today. Here is a summary of the key instructions:  - Medications:   - Use clindamycin  swabs on your face in the morning to keep bacteria count down   - Apply azelaic acid  15% cream at nighttime with moisturizer to control inflammation and help lighten dark spots  - Skin Care:   - Continue using CeraVe or La Roche-Posay gentle cleansers   - Avoid salicylic acid products   - Use Avene Tolerance moisturizer (samples will be provided)   - You can buy the Avene moisturizer at [place], [employer], or other stores  - Treatment Areas:   - Keep the biopsy area clean with soap and water    - Apply occlusive band-aid for about 2 weeks   - Try not to scrub the area when showering  - Follow-up:   - Lab results from the leg biopsy will be available in about a week   - You will receive a message with the results   - Return visit planned after you finish breastfeeding for stronger acne treatments  - Other Instructions:   - It will take 4 weeks to see a big difference in your acne   - Improvement continues with another 4 weeks and another 4 weeks after that   - Current treatment plan will continue until you are done  breastfeeding  Please reach out if you have any questions or concerns.  Warm regards,  Dr. Delon Lenis [facility]  Important Information   Due to recent changes in healthcare laws, you may see results of your pathology and/or laboratory studies on MyChart before the doctors have had a chance to review them. We understand that in some cases there may be results that are confusing or concerning to you. Please understand that not  all results are received at the same time and often the doctors may need to interpret multiple results in order to provide you with the best plan of care or course of treatment. Therefore, we ask that you please give us  2 business days to thoroughly review all your results before contacting the office for clarification. Should we see a critical lab result, you will be contacted sooner.     If You Need Anything After Your Visit   If you have any questions or concerns for your doctor, please call our main line at [phone]. If no one answers, please leave a voicemail as directed and we will return your call as soon as possible. Messages left after 4 pm will be answered the following business day.    You may also send us  a message via MyChart. We typically respond to MyChart messages within 1-2 business days.  For prescription refills, please ask your pharmacy to contact our office. Our fax number is [phone].  If you have an urgent issue when the clinic is closed that cannot wait until the next business day, you can page your doctor at the number below.     Please note that while we do our best to be available for urgent issues outside of office hours, we are not available 24/7.    If you have an urgent issue and are unable to reach us , you may choose to seek medical care at your doctor's office, retail clinic, urgent care center, or emergency room.   If you have a medical emergency, please immediately call 911 or go to the emergency department. In the event of inclement weather, please call our main line at [phone] for an update on the status of any delays or closures.  Dermatology Medication Tips: Please keep the boxes that topical medications come in in order to help keep track of the instructions about where and how to use these. Pharmacies typically print the medication instructions only on the boxes and not directly on the medication tubes.   If your medication is too  expensive, please contact our office at [phone] or send us  a message through MyChart.    We are unable to tell what your co-pay for medications will be in advance as this is different depending on your insurance coverage. However, we may be able to find a substitute medication at lower cost or fill out paperwork to get insurance to cover a needed medication.    If a prior authorization is required to get your medication covered by your insurance company, please allow us  1-2 business days to complete this process.   Drug prices often vary depending on where the prescription is filled and some pharmacies may offer cheaper prices.   The website www.goodrx.com contains coupons for medications through different pharmacies. The prices here do not account for what the cost may be with help from insurance (it may be cheaper with your insurance), but the website can give you the price if you did not use any insurance.  - You can print the  associated coupon and take it with your prescription to the pharmacy.  - You may also stop by our office during regular business hours and pick up a GoodRx coupon card.  - If you need your prescription sent electronically to a different pharmacy, notify our office through [facility] or by phone at [phone]

## 2023-11-13 NOTE — Progress Notes (Signed)
 I am better.  Her left ankle has much less pain, no swelling.  She is walking well. She has no new trauma.  ROM of the left ankle is full.  NV intact.  Encounter Diagnosis  Name Primary?   Strain of left ankle, subsequent encounter Yes   Return prn.  I have informed the patient I will be retiring from medical practice and from this office on November 21, 2023.  The patient has been offered continuing care with Dr. Margrette or Dr. Onesimo of this office.  The patient may choose another provider and the records will be forwarded after proper signature and notification.  Patient understands and agrees.  Call if any problem.  Precautions discussed.  Electronically Signed Lemond Stable, MD 9/24/20252:59 PM

## 2023-11-14 LAB — SURGICAL PATHOLOGY

## 2023-11-19 ENCOUNTER — Ambulatory Visit: Payer: Self-pay | Admitting: Dermatology

## 2023-12-23 ENCOUNTER — Encounter: Payer: Self-pay | Admitting: Radiology

## 2024-01-21 ENCOUNTER — Other Ambulatory Visit: Payer: Self-pay | Admitting: Internal Medicine

## 2024-01-21 ENCOUNTER — Telehealth: Payer: Self-pay | Admitting: Family Medicine

## 2024-01-21 DIAGNOSIS — F339 Major depressive disorder, recurrent, unspecified: Secondary | ICD-10-CM

## 2024-01-21 DIAGNOSIS — B3731 Acute candidiasis of vulva and vagina: Secondary | ICD-10-CM

## 2024-01-21 MED ORDER — ESCITALOPRAM OXALATE 5 MG PO TABS: 5.0000 mg | ORAL_TABLET | Freq: Every day | ORAL | 2 refills | Status: AC

## 2024-01-21 MED ORDER — FLUCONAZOLE 150 MG PO TABS: 150.0000 mg | ORAL_TABLET | Freq: Once | ORAL | 0 refills | Status: AC

## 2024-01-21 NOTE — Telephone Encounter (Unsigned)
 Copied from CRM #[id number]. Topic: Clinical - Refused Triage >> Jan 21, 2024  3:36 PM Isabel Blackwell wrote: Patient/caller voiced complaints of Yeast infection-vag discharge causing discomfort. Pt. Declined transfer to triage.

## 2024-01-21 NOTE — Telephone Encounter (Unsigned)
 Copied from CRM #[id number]. Topic: Clinical - Medication Refill >> Jan 21, 2024  3:34 PM Wess RAMAN wrote: Medication: escitalopram  (LEXAPRO ) 5 MG tablet   Has the patient contacted their pharmacy? No (Agent: If no, request that the patient contact the pharmacy for the refill. If patient does not wish to contact the pharmacy document the reason why and proceed with request.) (Agent: If yes, when and what did the pharmacy advise?)  This is the patient's preferred pharmacy:  [id number] - [place], [place] - [address] [place] [address] Phone: [phone] Fax: [phone]  Is this the correct pharmacy for this prescription? Yes If no, delete pharmacy and type the correct one.   Has the prescription been filled recently? Yes  Is the patient out of the medication? No  Has the patient been seen for an appointment in the last year OR does the patient have an upcoming appointment? Yes  Can we respond through MyChart? Yes  Agent: Please be advised that Rx refills may take up to 3 business days. We ask that you follow-up with your pharmacy.

## 2024-01-22 DIAGNOSIS — N760 Acute vaginitis: Secondary | ICD-10-CM | POA: Diagnosis not present

## 2024-01-22 NOTE — Telephone Encounter (Signed)
Pt informed

## 2024-02-18 ENCOUNTER — Ambulatory Visit: Payer: Self-pay | Admitting: Internal Medicine
